# Patient Record
Sex: Male | Born: 1960 | Hispanic: Yes | Marital: Married | Smoking: Never smoker
Health system: Southern US, Community
[De-identification: ages and names within clinical notes are randomized; demographics above are authoritative.]

## PROBLEM LIST (undated history)

## (undated) DIAGNOSIS — I10 Essential (primary) hypertension: Secondary | ICD-10-CM

## (undated) DIAGNOSIS — F419 Anxiety disorder, unspecified: Secondary | ICD-10-CM

## (undated) DIAGNOSIS — E785 Hyperlipidemia, unspecified: Secondary | ICD-10-CM

## (undated) DIAGNOSIS — E1169 Type 2 diabetes mellitus with other specified complication: Secondary | ICD-10-CM

## (undated) DIAGNOSIS — M6740 Ganglion, unspecified site: Secondary | ICD-10-CM

## (undated) DIAGNOSIS — R809 Proteinuria, unspecified: Secondary | ICD-10-CM

## (undated) DIAGNOSIS — K760 Fatty (change of) liver, not elsewhere classified: Secondary | ICD-10-CM

## (undated) DIAGNOSIS — E119 Type 2 diabetes mellitus without complications: Secondary | ICD-10-CM

## (undated) DIAGNOSIS — K219 Gastro-esophageal reflux disease without esophagitis: Secondary | ICD-10-CM

## (undated) DIAGNOSIS — R740 Nonspecific elevation of levels of transaminase and lactic acid dehydrogenase [LDH]: Secondary | ICD-10-CM

## (undated) DIAGNOSIS — M9251 Juvenile osteochondrosis of tibia and fibula, right leg: Secondary | ICD-10-CM

## (undated) DIAGNOSIS — S68119A Complete traumatic metacarpophalangeal amputation of unspecified finger, initial encounter: Secondary | ICD-10-CM

## (undated) DIAGNOSIS — F1020 Alcohol dependence, uncomplicated: Secondary | ICD-10-CM

## (undated) HISTORY — DX: Type 2 diabetes mellitus with other specified complication: E11.69

## (undated) HISTORY — DX: Complete traumatic metacarpophalangeal amputation of unspecified finger, initial encounter: S68.119A

## (undated) HISTORY — DX: Essential (primary) hypertension: I10

## (undated) HISTORY — DX: Alcohol dependence, uncomplicated: F10.20

## (undated) HISTORY — DX: Juvenile osteochondrosis of tibia and fibula, right leg: M92.51

## (undated) HISTORY — DX: Fatty (change of) liver, not elsewhere classified: K76.0

## (undated) HISTORY — DX: Anxiety disorder, unspecified: F41.9

## (undated) HISTORY — DX: Type 2 diabetes mellitus without complications: E11.9

## (undated) HISTORY — DX: Gastro-esophageal reflux disease without esophagitis: K21.9

## (undated) HISTORY — DX: Hyperlipidemia, unspecified: E78.5

## (undated) HISTORY — PX: OTHER SURGICAL HISTORY: SHX169

## (undated) HISTORY — DX: Nonspecific elevation of levels of transaminase and lactic acid dehydrogenase (ldh): R74.0

## (undated) HISTORY — DX: Ganglion, unspecified site: M67.40

## (undated) HISTORY — DX: Proteinuria, unspecified: R80.9

---

## 2007-02-21 ENCOUNTER — Emergency Department (HOSPITAL_COMMUNITY): Admission: EM | Admit: 2007-02-21 | Discharge: 2007-02-21 | Payer: Self-pay | Admitting: Emergency Medicine

## 2007-03-22 ENCOUNTER — Ambulatory Visit (HOSPITAL_COMMUNITY): Admission: RE | Admit: 2007-03-22 | Discharge: 2007-03-22 | Payer: Self-pay | Admitting: Internal Medicine

## 2008-06-21 ENCOUNTER — Inpatient Hospital Stay (HOSPITAL_COMMUNITY): Admission: EM | Admit: 2008-06-21 | Discharge: 2008-06-24 | Payer: Self-pay | Admitting: Emergency Medicine

## 2009-03-22 ENCOUNTER — Ambulatory Visit: Payer: Self-pay | Admitting: Family Medicine

## 2009-03-22 ENCOUNTER — Ambulatory Visit (HOSPITAL_COMMUNITY): Admission: RE | Admit: 2009-03-22 | Discharge: 2009-03-22 | Payer: Self-pay | Admitting: Family Medicine

## 2009-03-22 DIAGNOSIS — I10 Essential (primary) hypertension: Secondary | ICD-10-CM

## 2009-03-22 DIAGNOSIS — F1020 Alcohol dependence, uncomplicated: Secondary | ICD-10-CM

## 2009-03-22 HISTORY — DX: Essential (primary) hypertension: I10

## 2009-03-22 HISTORY — DX: Alcohol dependence, uncomplicated: F10.20

## 2009-03-24 ENCOUNTER — Encounter: Payer: Self-pay | Admitting: Family Medicine

## 2009-03-24 LAB — CONVERTED CEMR LAB
ALT: 52 units/L (ref 0–53)
Albumin: 4.5 g/dL (ref 3.5–5.2)
BUN: 9 mg/dL (ref 6–23)
HCT: 46.2 % (ref 39.0–52.0)
Hemoglobin: 15.2 g/dL (ref 13.0–17.0)
RBC: 5.04 M/uL (ref 4.22–5.81)
Total Bilirubin: 0.5 mg/dL (ref 0.3–1.2)
WBC: 8.6 10*3/uL (ref 4.0–10.5)

## 2009-03-25 ENCOUNTER — Encounter: Payer: Self-pay | Admitting: Family Medicine

## 2009-04-01 IMAGING — CR DG CHEST 2V
2 series · 2 of 2 positions shown · non-contrast
Comparison: none

CLINICAL DATA: Upper back pain.
 CHEST - 2 VIEW: 
 PA and lateral chest - 02/21/07.

[view not recorded (1 of 2)]
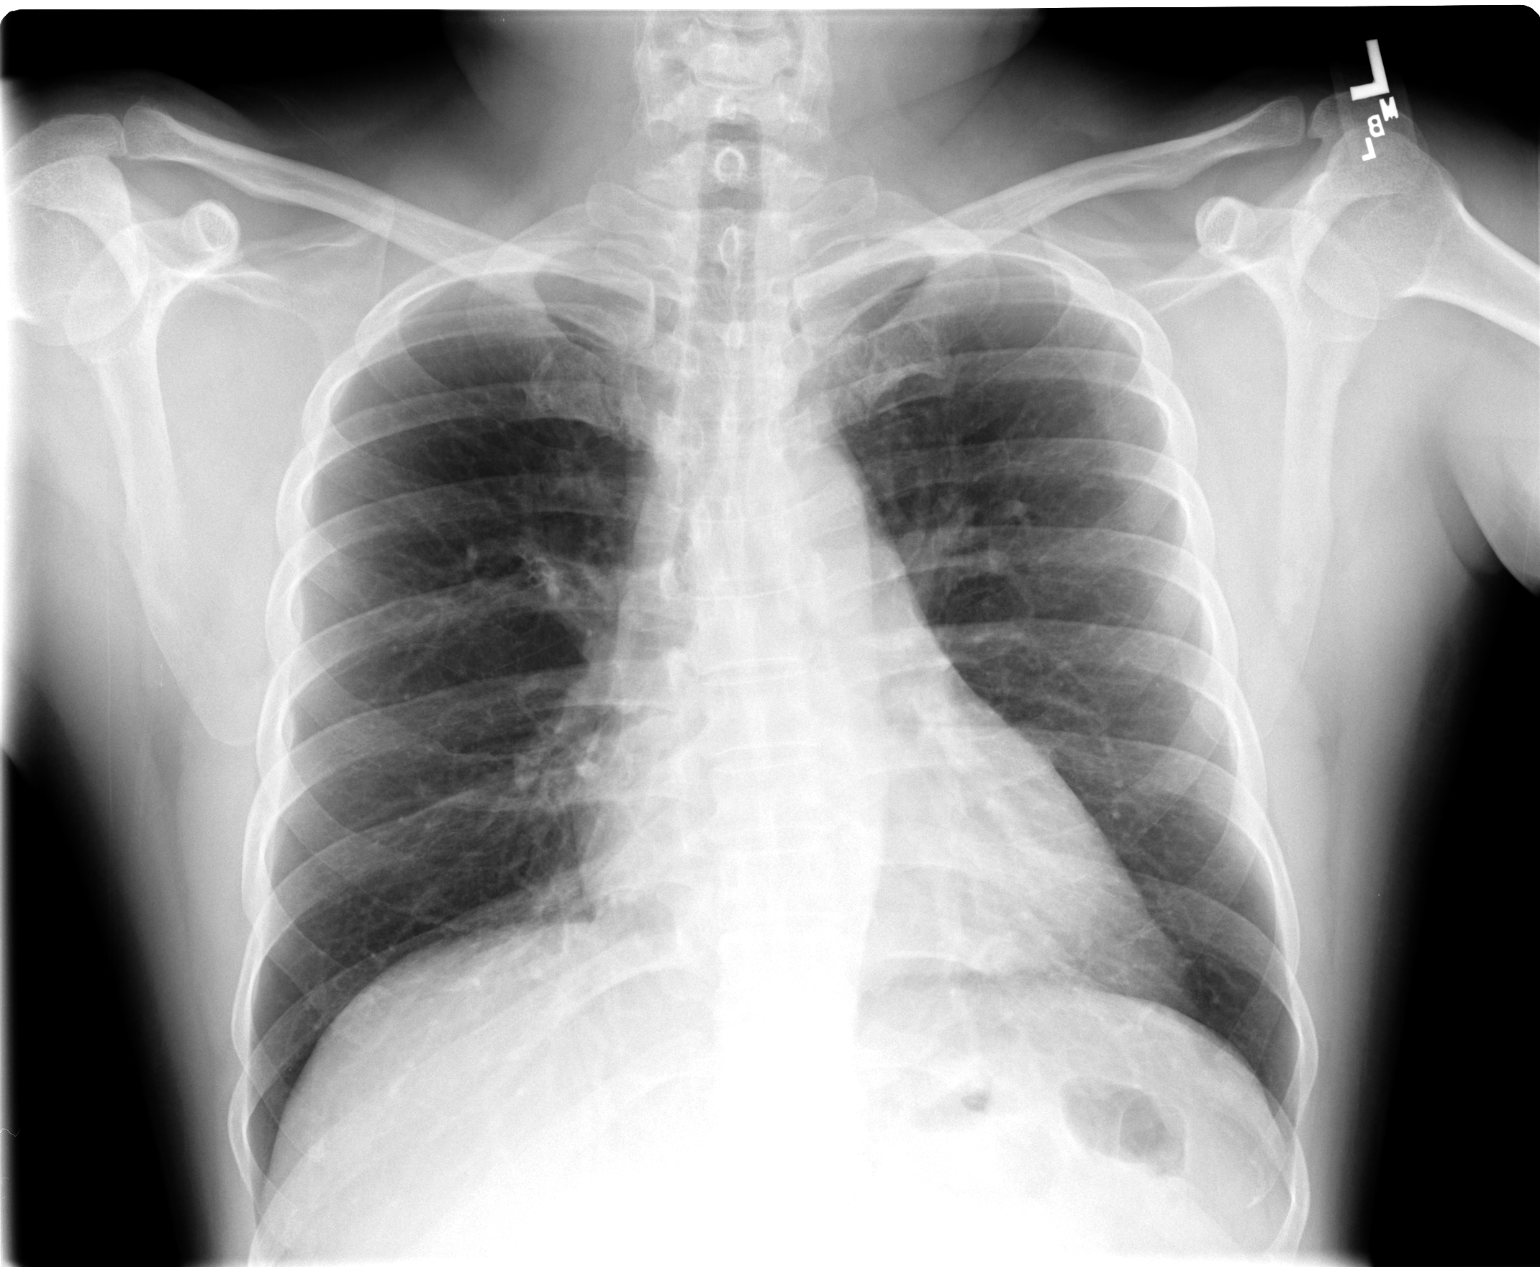

[view not recorded (2 of 2)]
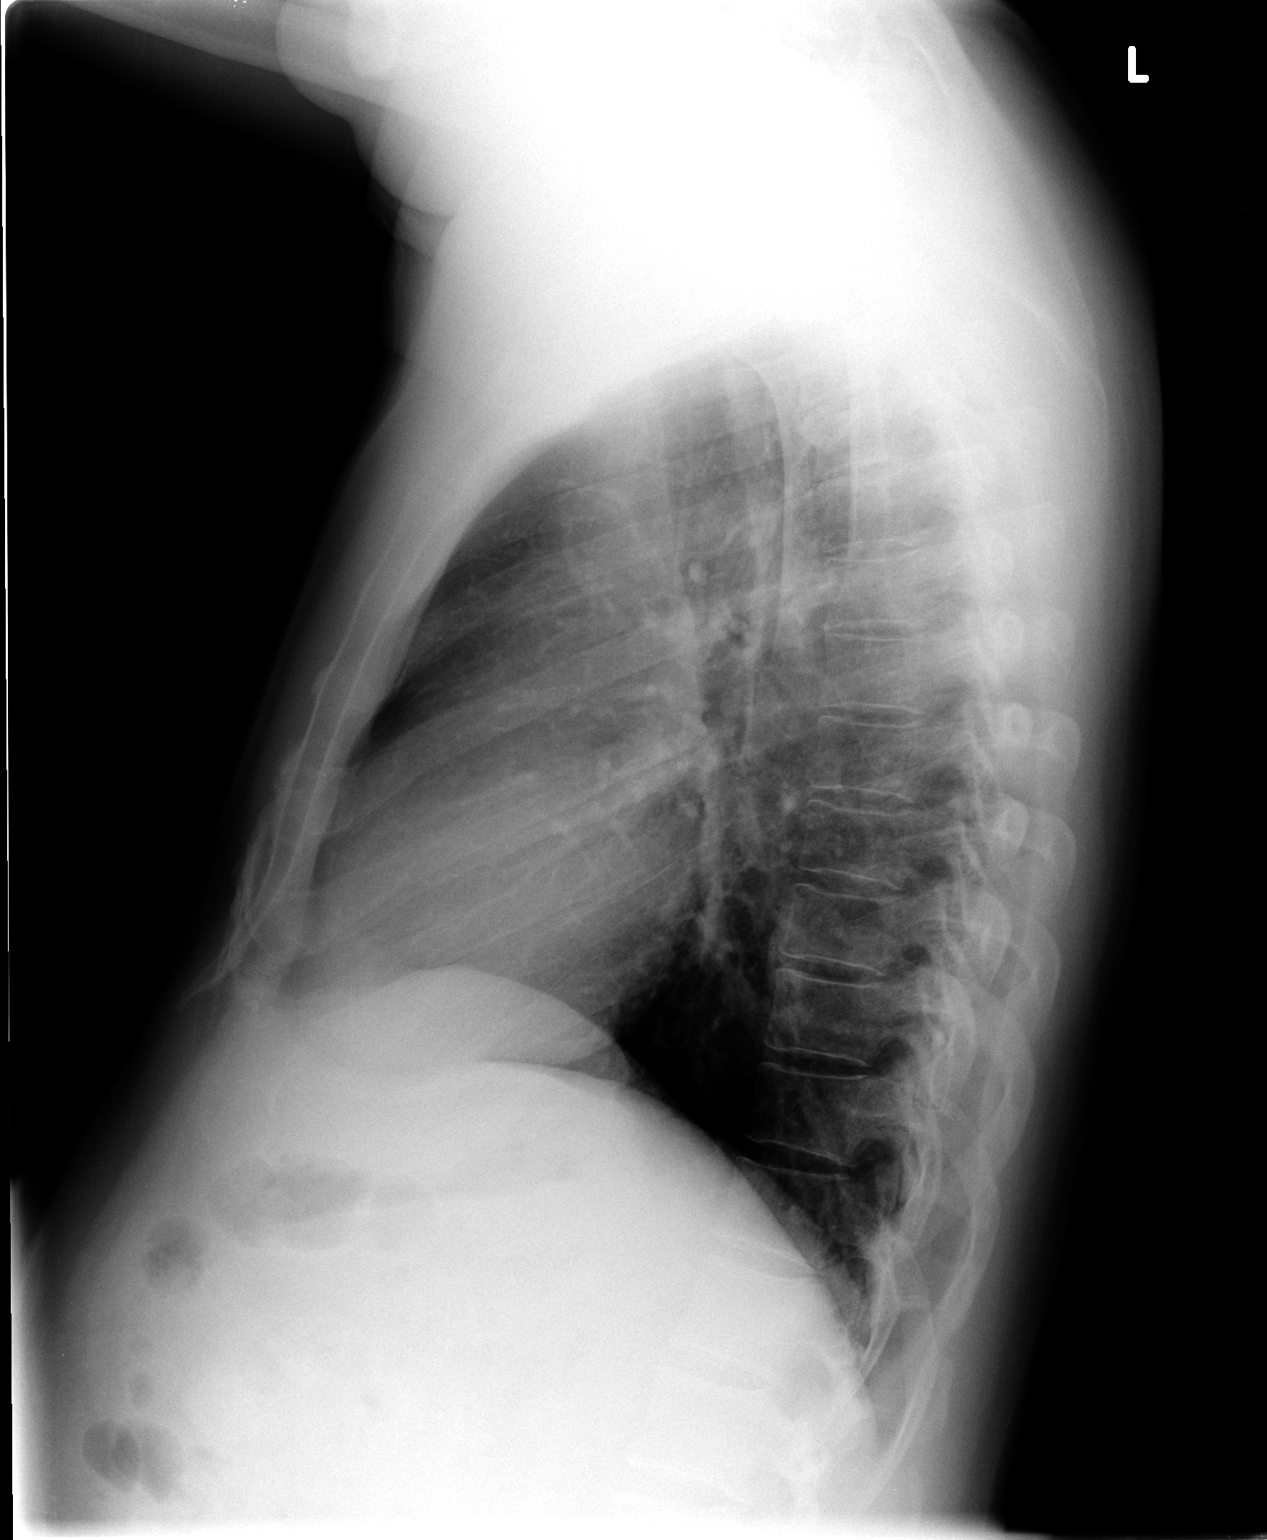

[2 of 2 positions shown; findings below may reference images not displayed]

FINDINGS: The lungs are clear.   The costophrenic angles are sharp.   The heart size is normal.
IMPRESSION: No acute disease.

## 2009-04-05 ENCOUNTER — Ambulatory Visit: Payer: Self-pay | Admitting: Family Medicine

## 2009-04-05 DIAGNOSIS — E785 Hyperlipidemia, unspecified: Secondary | ICD-10-CM | POA: Insufficient documentation

## 2009-04-05 DIAGNOSIS — E118 Type 2 diabetes mellitus with unspecified complications: Secondary | ICD-10-CM | POA: Insufficient documentation

## 2009-04-05 HISTORY — DX: Hyperlipidemia, unspecified: E78.5

## 2009-04-07 HISTORY — PX: OTHER SURGICAL HISTORY: SHX169

## 2009-04-13 ENCOUNTER — Ambulatory Visit: Payer: Self-pay | Admitting: Family Medicine

## 2009-04-13 ENCOUNTER — Ambulatory Visit (HOSPITAL_COMMUNITY): Admission: RE | Admit: 2009-04-13 | Discharge: 2009-04-13 | Payer: Self-pay | Admitting: Sports Medicine

## 2009-04-19 ENCOUNTER — Ambulatory Visit: Payer: Self-pay | Admitting: Family Medicine

## 2009-04-19 DIAGNOSIS — E663 Overweight: Secondary | ICD-10-CM | POA: Insufficient documentation

## 2009-04-20 ENCOUNTER — Ambulatory Visit: Payer: Self-pay | Admitting: Cardiology

## 2009-04-20 LAB — CONVERTED CEMR LAB
Basophils Absolute: 0.1 10*3/uL (ref 0.0–0.1)
Basophils Relative: 0.9 % (ref 0.0–3.0)
CO2: 30 meq/L (ref 19–32)
Calcium: 9.9 mg/dL (ref 8.4–10.5)
Chloride: 99 meq/L (ref 96–112)
Eosinophils Absolute: 0.6 10*3/uL (ref 0.0–0.7)
Eosinophils Relative: 8 % — ABNORMAL HIGH (ref 0.0–5.0)
GFR calc non Af Amer: 95.88 mL/min (ref >60)
Glucose, Bld: 107 mg/dL — ABNORMAL HIGH (ref 70–99)
HCT: 48.3 % (ref 39.0–52.0)
Lymphocytes Relative: 33.4 % (ref 12.0–46.0)
MCHC: 33.9 g/dL (ref 30.0–36.0)
MCV: 93 fL (ref 78.0–100.0)
Monocytes Absolute: 0.7 10*3/uL (ref 0.1–1.0)
Neutro Abs: 3.8 10*3/uL (ref 1.4–7.7)
Neutrophils Relative %: 49.2 % (ref 43.0–77.0)
Platelets: 247 10*3/uL (ref 150.0–400.0)
Prothrombin Time: 10.7 s (ref 9.1–11.7)
RBC: 5.2 M/uL (ref 4.22–5.81)
RDW: 11.6 % (ref 11.5–14.6)

## 2009-04-23 ENCOUNTER — Ambulatory Visit: Payer: Self-pay | Admitting: Cardiology

## 2009-04-23 ENCOUNTER — Ambulatory Visit (HOSPITAL_COMMUNITY): Admission: RE | Admit: 2009-04-23 | Discharge: 2009-04-23 | Payer: Self-pay | Admitting: Cardiology

## 2009-05-18 ENCOUNTER — Ambulatory Visit: Payer: Self-pay | Admitting: Cardiology

## 2009-05-24 ENCOUNTER — Ambulatory Visit: Payer: Self-pay | Admitting: Family Medicine

## 2009-05-24 DIAGNOSIS — I209 Angina pectoris, unspecified: Secondary | ICD-10-CM

## 2009-05-24 DIAGNOSIS — I2510 Atherosclerotic heart disease of native coronary artery without angina pectoris: Secondary | ICD-10-CM | POA: Insufficient documentation

## 2009-06-14 ENCOUNTER — Ambulatory Visit: Payer: Self-pay | Admitting: Cardiology

## 2009-06-14 ENCOUNTER — Ambulatory Visit: Payer: Self-pay | Admitting: Internal Medicine

## 2009-06-15 LAB — CONVERTED CEMR LAB
AST: 20 units/L (ref 0–37)
Cholesterol: 153 mg/dL (ref 0–200)
HDL: 47.8 mg/dL (ref >39.00)
Total Bilirubin: 0.9 mg/dL (ref 0.3–1.2)
Total CHOL/HDL Ratio: 3
Triglycerides: 62 mg/dL (ref 0.0–149.0)
VLDL: 12.4 mg/dL (ref 0.0–40.0)

## 2009-07-05 ENCOUNTER — Ambulatory Visit: Payer: Self-pay | Admitting: Family Medicine

## 2009-08-23 ENCOUNTER — Ambulatory Visit: Payer: Self-pay | Admitting: Family Medicine

## 2009-08-23 LAB — CONVERTED CEMR LAB
BUN: 10 mg/dL (ref 6–23)
Chloride: 93 meq/L — ABNORMAL LOW (ref 96–112)
Creatinine, Ser: 0.7 mg/dL (ref 0.40–1.50)

## 2009-08-27 ENCOUNTER — Telehealth: Payer: Self-pay | Admitting: *Deleted

## 2009-09-20 ENCOUNTER — Ambulatory Visit: Payer: Self-pay | Admitting: Family Medicine

## 2009-09-20 LAB — CONVERTED CEMR LAB: Hgb A1c MFr Bld: 5.6 %

## 2009-09-23 ENCOUNTER — Encounter (INDEPENDENT_AMBULATORY_CARE_PROVIDER_SITE_OTHER): Payer: Self-pay | Admitting: *Deleted

## 2009-11-15 ENCOUNTER — Ambulatory Visit: Payer: Self-pay | Admitting: Cardiology

## 2009-12-13 ENCOUNTER — Ambulatory Visit: Payer: Self-pay | Admitting: Family Medicine

## 2009-12-13 DIAGNOSIS — M79609 Pain in unspecified limb: Secondary | ICD-10-CM

## 2010-05-30 ENCOUNTER — Ambulatory Visit: Payer: Self-pay | Admitting: Family Medicine

## 2010-05-30 DIAGNOSIS — R809 Proteinuria, unspecified: Secondary | ICD-10-CM | POA: Insufficient documentation

## 2010-06-20 ENCOUNTER — Encounter: Payer: Self-pay | Admitting: Family Medicine

## 2010-06-20 ENCOUNTER — Ambulatory Visit: Payer: Self-pay | Admitting: Family Medicine

## 2010-06-20 LAB — CONVERTED CEMR LAB
BUN: 9 mg/dL (ref 6–23)
Chloride: 99 meq/L (ref 96–112)
HDL: 72 mg/dL (ref >39)
Total CHOL/HDL Ratio: 3.1

## 2010-06-24 ENCOUNTER — Encounter: Payer: Self-pay | Admitting: Family Medicine

## 2010-06-27 ENCOUNTER — Ambulatory Visit: Payer: Self-pay | Admitting: Family Medicine

## 2010-09-06 NOTE — Assessment & Plan Note (Signed)
Summary: f/up DM,Htn,Etoh,tcb   Vital Signs:  Patient profile:   50 year old male Height:      62 inches Weight:      134.1 pounds BMI:     24.62 Pulse rate:   71 / minute BP sitting:   136 / 86  (left arm)  Vitals Entered By: Arlyss Repress CMA, (September 20, 2009 4:11 PM) CC: f/up DM/HTN. refill meds. Is Patient Diabetic? Yes Pain Assessment Patient in pain? no        Primary Care Provider:  Dr. Sheffield Slider  CC:  f/up DM/HTN. refill meds..  History of Present Illness: Jeffrey Gray has felt generally well. 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 Gets soreness in his back muscles and has had a buzzing feeling (like a cell phone) in his left anterior thigh, but no numbness there now. Ran out of Metformin 3 days ago.   He continues to drink 8 or more beers with his brother on weekend, but no more than 2 on week nights.  Daughter, [name], is with him and affirms that his alcohol is a problem.   He hasn't had hypoglycemia symptoms. Feels hungry now since he hasn't eaten since 10 AM.   Habits & Providers  Alcohol-Tobacco-Diet     Tobacco Status: never  Allergies: No Known Drug Allergies  Physical Exam  General:  Well-developed,well-nourished,in no acute distress; alert,appropriate and cooperative throughout examination Lungs:  Normal respiratory effort, chest expands symmetrically. Lungs are clear to auscultation, no crackles or wheezes. Heart:  Normal rate and regular rhythm. S1 and S2 normal without gallop, murmur, click, rub or other extra sounds. Abdomen:  Bowel sounds positive,abdomen soft and non-tender without masses, organomegaly or hernias noted. Psych:  normally interactive, good eye contact, not anxious appearing, and not depressed appearing.     Impression & Recommendations:  Problem # 1:  DIABETES MELLITUS, TYPE II (ICD-250.00) Improved control and in range to have hypoglycemic symptoms. Will decrease Metformin The following medications were removed from  the medication list:    Lisinopril 40 Mg Tabs (Lisinopril) .Marland Kitchen... Take 1/2 tab daily His updated medication list for this problem includes:    Metformin Hcl 1000 Mg Tabs (Metformin hcl) .Marland Kitchen... Tomar 1/2  tab po qd   for diabetes    Aspirin 81 Mg Tbec (Aspirin) .Marland Kitchen... 1 daily    Zestoretic 20-12.5 Mg Tabs (Lisinopril-hydrochlorothiazide) .Marland Kitchen... Take one tablet daily  Orders: A1C-[facility] (11914) [facility]- Est  Level 4 (78295)  Problem # 2:  HYPERTENSION (ICD-401.9)  Not at goal. Hyponatremia on 25 mg HCTZ so will decrease that and use up the Lisinopril over the next 8 days then replace with Zestoretic.  The following medications were removed from the medication list:    Hydrochlorothiazide 25 Mg Tabs (Hydrochlorothiazide) .Marland Kitchen... 1 tablet by mouth daily for blood pressure    Lisinopril 40 Mg Tabs (Lisinopril) .Marland Kitchen... Take 1/2 tab daily His updated medication list for this problem includes:    Metoprolol Tartrate 25 Mg Tabs (Metoprolol tartrate) .Marland Kitchen... 1 tablet twice a day    Norvasc 5 Mg Tabs (Amlodipine besylate) .Marland Kitchen... Take one tablet daily    Zestoretic 20-12.5 Mg Tabs (Lisinopril-hydrochlorothiazide) .Marland Kitchen... Take one tablet daily  Orders: [facility]- Est  Level 4 (62130)  Problem # 3:  CORONARY ARTERY SPASM (ICD-413.9)  Pulse is 71 so will increase Metoprolol to help decrease blood pressure  The following medications were removed from the medication list:    Hydrochlorothiazide 25 Mg Tabs (Hydrochlorothiazide) .Marland Kitchen... 1 tablet by  mouth daily for blood pressure    Lisinopril 40 Mg Tabs (Lisinopril) .Marland Kitchen... Take 1/2 tab daily His updated medication list for this problem includes:    Metoprolol Tartrate 25 Mg Tabs (Metoprolol tartrate) .Marland Kitchen... 1 tablet twice a day    Nitrostat 0.4 Mg Subl (Nitroglycerin) .Marland Kitchen... 1 under your tongue as needed for chest pain-you may take 1 every 5 minutes x 3    Norvasc 5 Mg Tabs (Amlodipine besylate) .Marland Kitchen... Take one tablet daily    Aspirin 81 Mg Tbec (Aspirin) .Marland Kitchen... 1 daily     Zestoretic 20-12.5 Mg Tabs (Lisinopril-hydrochlorothiazide) .Marland Kitchen... Take one tablet daily  Orders: [facility]- Est  Level 4 (27253)  Problem # 4:  OVERWEIGHT (ICD-278.02) Now in normal range BMI under 25  Problem # 5:  ALCOHOL ABUSE (ICD-305.00) He committed to limiting beers to two a day. I suggested substituting non-alcoholic beer. I'm dubious that he is committed to this change yet.  Orders: [facility]- Est  Level 4 (66440)  Complete Medication List: 1)  Pravastatin Sodium 40 Mg Tabs (Pravastatin sodium) .Marland Kitchen.. 1 tablet daily at bedtime for cholesterol. instructions in spanish 2)  Metformin Hcl 1000 Mg Tabs (Metformin hcl) .... Tomar 1/2  tab po qd   for diabetes 3)  Metoprolol Tartrate 25 Mg Tabs (Metoprolol tartrate) .Marland Kitchen.. 1 tablet twice a day 4)  Nitrostat 0.4 Mg Subl (Nitroglycerin) .Marland Kitchen.. 1 under your tongue as needed for chest pain-you may take 1 every 5 minutes x 3 5)  Norvasc 5 Mg Tabs (Amlodipine besylate) .... Take one tablet daily 6)  Aspirin 81 Mg Tbec (Aspirin) .Marland Kitchen.. 1 daily 7)  Zestoretic 20-12.5 Mg Tabs (Lisinopril-hydrochlorothiazide) .... Take one tablet daily  Patient Instructions: 1)  Please schedule a follow-up appointment in 2 months.  2)  Regrese en 2 meses para chequeo de presion y [place]. 3)  Para 8 dias tome la mitad de HCTZ y la mitad de Lisinopril. Despues replazar los dos con una combinacion de HCTZ y Lisinopril 4)  Aumente las dosis de Metoprolol hasta una tableta [employer] veces al dia.  Prescriptions: METFORMIN HCL 1000 MG TABS (METFORMIN HCL) Tomar 1/2  tab po qd   For diabetes  #30 x 11   Entered and Authorized by:   Zachery Dauer MD   Signed by:   Zachery Dauer MD on 09/20/2009   Method used:   Electronically to        [facility] (retail)       [address]       [place], [place]  [address]       Ph: [phone]       Fax: [phone]   RxID:   [id number] ZESTORETIC 20-12.5 MG TABS (LISINOPRIL-HYDROCHLOROTHIAZIDE) Take one tablet  daily  #30 x 11   Entered and Authorized by:   Zachery Dauer MD   Signed by:   Zachery Dauer MD on 09/20/2009   Method used:   Electronically to        [facility] (retail)       [address]       [place], [place]  [address]       Ph: [phone]       Fax: [phone]   RxID:   [id number] METOPROLOL TARTRATE 25 MG TABS (METOPROLOL TARTRATE) 1 tablet twice a day  #60 x 11   Entered and Authorized by:  Zachery Dauer MD   Signed by:   Zachery Dauer MD on 09/20/2009   Method used:   Electronically to        [facility] (retail)       [address]       [place], [place]  [address]       Ph: [phone]       Fax: [phone]   RxID:   [id number]   Laboratory Results   Blood Tests   Date/Time Received: September 20, 2009 4:22 PM  Date/Time Reported: September 20, 2009 4:30 PM   HGBA1C: 5.6%   (Normal Range: Non-Diabetic - 3-6%   Control Diabetic - 6-8%)  Comments: ...........test performed by...........Marland KitchenTerese Door, CMA       Prevention & Chronic Care Immunizations   Influenza vaccine: Fluvax Non-MCR  (05/24/2009)    Tetanus booster: 05/24/2009: Tdap    Pneumococcal vaccine: Pneumovax  (07/05/2009)  Other Screening   Smoking status: never  (09/20/2009)  Diabetes Mellitus   HgbA1C: 5.6  (09/20/2009)    Eye exam: Not documented    Foot exam: yes  (04/05/2009)   High risk foot: Not documented   Foot care education: Not documented    Urine microalbumin/creatinine ratio: Not documented    Diabetes flowsheet reviewed?: Yes   Progress toward A1C goal: At goal  Lipids   Total Cholesterol: 153  (06/14/2009)   LDL: 93  (06/14/2009)   LDL Direct: 162  (03/22/2009)   HDL: 47.80  (06/14/2009)   Triglycerides: 62.0  (06/14/2009)    SGOT (AST): 20  (06/14/2009)   SGPT (ALT): 19  (06/14/2009)   Alkaline phosphatase: 75  (06/14/2009)   Total bilirubin: 0.9  (06/14/2009)    Lipid flowsheet  reviewed?: Yes   Progress toward LDL goal: At goal  Hypertension   Last Blood Pressure: 136 / 86  (09/20/2009)   Serum creatinine: 0.70  (08/23/2009)   Serum potassium 4.6  (08/23/2009)    Hypertension flowsheet reviewed?: Yes   Progress toward BP goal: Improved  Self-Management Support :   Personal Goals (by the next clinic visit) :     Personal A1C goal: 7  (04/19/2009)     Personal blood pressure goal: 130/80  (04/19/2009)     Personal LDL goal: 70  (04/19/2009)    Diabetes self-management support: Written self-care plan  (04/19/2009)    Hypertension self-management support: Written self-care plan  (04/19/2009)    Lipid self-management support: Written self-care plan  (04/19/2009)

## 2010-09-06 NOTE — Progress Notes (Signed)
Summary: re: HCTZ/TS  ---- Converted from flag ---- ---- 08/27/2009 7:32 AM, Zachery Dauer MD wrote: Please ask Sr Rodriquez to decrease his HCTZ to 1/2 tab each morning and scedule a follow-up visit in 2-3 weeks. ------------------------------ called pt's house and spoke with pt's wife, request for pt to call us back. waiting for pt to call back. Arlyss Repress CMA,  August 27, 2009 9:57 AM pt's dtr [name] called back and i infrormed her of dr.hale's message.Arlyss Repress CMA,  August 27, 2009 1:57 PM

## 2010-09-06 NOTE — Assessment & Plan Note (Signed)
Summary: f/u cholesterol/bmc   FLU SHOT GIVEN TODAY.Jeffrey Gray, CMA  May 30, 2010 3:54 PM  Vital Signs:  Patient profile:   50 year old male Height:      62 inches Weight:      139 pounds BMI:     25.52 Temp:     98.9 degrees F oral Pulse rate:   87 / minute BP sitting:   158 / 90  (right arm) Cuff size:   regular  Vitals Entered By: Tessie Fass CMA (May 30, 2010 2:24 PM)  Serial Vital Signs/Assessments:  Time      Position  BP       Pulse  Resp  Temp     By                     145/90                         Angelena Sole MD   Primary Care Provider:  Dr. Sheffield Slider  CC:  f/u HTN, HLD, and and DM.  History of Present Illness: 1. DMII:  Overall his sugars are well controlled.  He doesn't check his blood sugars at home.  He has also been watching his diet.  He is taking Metformin as prescribed.  ROS: denies vision changes  2. HTN:  He doesn't check his blood pressure regularly.  He is taking the Lisinopril-HCTZ and Norvasc but not the Metoprolol.  States that the Norvasc is going to cost him around $100 to fill and would like to stop it.  ROS: denies chest pain and shortness of breath  3. HLD:  He is taking the Pravastatin as prescribed.  Trying to watch his diet.  ROS: denies claudication  Current Medications (verified): 1)  Pravastatin Sodium 40 Mg Tabs (Pravastatin Sodium) .Marland Kitchen.. 1 Tablet Daily At Bedtime For Cholesterol. Instructions in Spanish 2)  Metformin Hcl 1000 Mg Tabs (Metformin Hcl) .... Tomar 1/2  Tab Po Qd   For Diabetes 3)  Metoprolol Tartrate 25 Mg Tabs (Metoprolol Tartrate) .Marland Kitchen.. 1 Tablet Twice A Day 4)  Nitrostat 0.4 Mg Subl (Nitroglycerin) .Marland Kitchen.. 1 Under Your Tongue As Needed For Chest Pain-You May Take 1 Every 5 Minutes X 3 5)  Aspirin 81 Mg Tbec (Aspirin) .Marland Kitchen.. 1 Daily 6)  Zestoretic 20-12.5 Mg Tabs (Lisinopril-Hydrochlorothiazide) .... Take Two Tabs By Mouth Daily  Allergies (verified): No Known Drug Allergies  Past History:  Past Medical  History: Reviewed history from 11/12/2009 and no changes required. 1. HTN 2. alcohol abuse vs dependence 3. Chest pain: ETT (9/10) equivocal (8.5 METS, nonspecific changes but chest pain with exertion).  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 EF 65%. There was, however, noted to be significant vasospasm in the left main and LAD relieved by intracoronary nitroglycerine.  He is now on amlodipine for treatment of possible coronary vasospasm.  4. DM2 5. Hyperlipidemia  Social History: Reviewed history from 05/24/2009 and no changes required. Llives with wife and 3 children, also has a grown son.   Works Administrator buildings.   Education 10th grade Never smoked.   Drank heavily in the past but has cut back.  Moved here from [place] about 10 years from [place].    Physical Exam  General:  Vitals reviewed and rechecked.  Slightly overweight.  well appearing, no apparent distress. Eyes:  vision grossly intact.   Neck:  supple and full ROM.   Lungs:  Normal respiratory effort,  chest expands symmetrically. Lungs are clear to auscultation, no crackles or wheezes. Heart:  Normal rate and regular rhythm. S1 and S2 normal without gallop, murmur, click, rub or other extra sounds. Extremities:  no LE edema Neurologic:  alert & oriented X3 and gait normal.     Impression & Recommendations:  Problem # 1:  DIABETES MELLITUS, TYPE II (ICD-250.00) Assessment Unchanged HgA1C 5.9 today.  At goal.  Continue current medications His updated medication list for this problem includes:    Metformin Hcl 1000 Mg Tabs (Metformin hcl) .Marland Kitchen... Tomar 1/2  tab po qd   for diabetes    Aspirin 81 Mg Tbec (Aspirin) .Marland Kitchen... 1 daily    Zestoretic 20-12.5 Mg Tabs (Lisinopril-hydrochlorothiazide) .Marland Kitchen... Take two tabs by mouth daily  Orders: UA Microalbumin-[facility] (82044) A1C-[facility] (52841) [facility]- Est  Level 4 (32440)  Problem # 2:  HYPERTENSION (ICD-401.9) Assessment: Unchanged  Not at goal.  Will make some  medication adjustments.  Will stop Amlodipine because of cost.  Will restart Metoprolol and increase Zestoretic.  Will have pt f/u in 1 month to recheck. The following medications were removed from the medication list:    Amlodipine Besylate 5 Mg Tabs (Amlodipine besylate) ..... One and one-half tablets daily His updated medication list for this problem includes:    Metoprolol Tartrate 25 Mg Tabs (Metoprolol tartrate) .Marland Kitchen... 1 tablet twice a day    Zestoretic 20-12.5 Mg Tabs (Lisinopril-hydrochlorothiazide) .Marland Kitchen... Take two tabs by mouth daily  Orders: Basic Met-[facility] ([address]272-53664) [facility]- Est  Level 4 (40347)  Problem # 3:  HYPERLIPIDEMIA (ICD-272.4) Assessment: Unchanged  Last LDL 93 in 06/2009.  Will have him come back and recheck 1 week prior to his next appointment. His updated medication list for this problem includes:    Pravastatin Sodium 40 Mg Tabs (Pravastatin sodium) .Marland Kitchen... 1 tablet daily at bedtime for cholesterol. 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 1 tablet daily at bedtime for cholesterol. instructions in spanish 2)  Metformin Hcl 1000 Mg Tabs (Metformin hcl) .... Tomar 1/2  tab po qd   for diabetes 3)  Metoprolol Tartrate 25 Mg Tabs (Metoprolol tartrate) .Marland Kitchen.. 1 tablet twice a day 4)  Nitrostat 0.4 Mg Subl (Nitroglycerin) .Marland Kitchen.. 1 under your tongue as needed for chest pain-you may take 1 every 5 minutes x 3 5)  Aspirin 81 Mg Tbec (Aspirin) .Marland Kitchen.. 1 daily 6)  Zestoretic 20-12.5 Mg Tabs (Lisinopril-hydrochlorothiazide) .... Take two tabs by mouth daily  Other Orders: Flu Vaccine 50yrs + (95188) Admin 1st Vaccine (41660)  Patient Instructions: 1)  It was nice to meet you today 2)  We are going to make some changes to your blood pressure  medicines. 3)  I want you to stop taking the Amlodipine.  For the Zestoretic take two tabs once a day and start taking the Metoprolol again. 4)  We will check your cholesterol 1 week prior to your next appointment, this will be a fasting lab draw so don't eat for 8 hours prior to your visit. 5)  Please schedule a lab visit in 3 weeks and an office visit with Dr. Sheffield Slider in 4 weeks. Prescriptions: METOPROLOL TARTRATE 25 MG TABS (METOPROLOL TARTRATE) 1 tablet twice a day  #60 x 6   Entered by:   Angelena Sole MD   Authorized by:  Zachery Dauer MD   Signed by:   Angelena Sole MD on 05/30/2010   Method used:   Electronically to        [facility] (retail)       [address]       [place], [place]  [address]       Ph: [phone]       Fax: [phone]   RxID:   [id number] ZESTORETIC 20-12.5 MG TABS (LISINOPRIL-HYDROCHLOROTHIAZIDE) Take two tabs by mouth daily  #60 x 6   Entered by:   Angelena Sole MD   Authorized by:   Zachery Dauer MD   Signed by:   Angelena Sole MD on 05/30/2010   Method used:   Electronically to        [facility] (retail)       [address]       [place], [place]  [address]       Ph: [phone]       Fax: [phone]   RxID:   [id number]    Orders Added: 1)  UA Microalbumin-[facility] [82044] 2)  A1C-[facility] [83036] 3)  Basic Met-[facility] [51025-85277] 4)  Lipid-[facility] [80061-22930] 5)  Flu Vaccine 84yrs + [90658] 6)  Admin 1st Vaccine [90471] 7)  [facility]- Est  Level 4 [82423]   Immunizations Administered:  Influenza Vaccine # 1:    Vaccine Type: Fluvax 3+    Site: left deltoid    Mfr: GlaxoSmithKline    Dose: 0.5 ml    Route: IM    Given by: Jeffrey Gray, CMA    Exp. Date: 02/01/2011    Lot #: [id number]    VIS given: 03/01/10 version given May 30, 2010.  Flu Vaccine Consent Questions:    Do you have a history of severe allergic reactions to this vaccine? no    Any prior history of allergic  reactions to egg and/or gelatin? no    Do you have a sensitivity to the preservative Thimersol? no    Do you have a past history of Guillan-Barre Syndrome? no    Do you currently have an acute febrile illness? no    Have you ever had a severe reaction to latex? no    Vaccine information given and explained to patient? yes   Immunizations Administered:  Influenza Vaccine # 1:    Vaccine Type: Fluvax 3+    Site: left deltoid    Mfr: GlaxoSmithKline    Dose: 0.5 ml    Route: IM    Given by: Jeffrey Gray, CMA    Exp. Date: 02/01/2011    Lot #: [id number]    VIS given: 03/01/10 version given May 30, 2010.  Laboratory Results   Urine Tests  Date/Time Received: May 30, 2010 2:29 PM  Date/Time Reported: May 30, 2010 2:57 PM   Microalbumin (urine): 30 mg/L Creatinine: 50mg /dL  A:C Ratio 93-267 Abnormal Comments: ...........test performed by...........Marland KitchenTerese Door, CMA   Blood Tests   Date/Time Received: May 30, 2010 2:29 PM  Date/Time Reported: May 30, 2010 2:44 PM   HGBA1C: 5.9%   (Normal Range: Non-Diabetic - 3-6%   Control Diabetic - 6-8%)  Comments: ...........test performed by...........Marland KitchenTessie Fass, CMA entered by Terese Door, CMA           Prevention & Chronic Care Immunizations   Influenza vaccine: Fluvax 3+  (05/30/2010)    Tetanus booster: 05/24/2009: Tdap  Pneumococcal vaccine: Pneumovax  (07/05/2009)  Other Screening   Smoking status: never  (12/13/2009)  Diabetes Mellitus   HgbA1C: 5.9  (05/30/2010)    Eye exam: Not documented    Foot exam: yes  (12/13/2009)   Foot exam action/deferral: Do today   High risk foot: No  (12/13/2009)   Foot care education: Done  (12/13/2009)    Urine microalbumin/creatinine ratio: Not documented    Diabetes flowsheet reviewed?: Yes   Progress toward A1C goal: At goal  Lipids   Total Cholesterol: 153  (06/14/2009)   LDL: 93  (06/14/2009)   LDL Direct: 162  (03/22/2009)   HDL:  47.80  (06/14/2009)   Triglycerides: 62.0  (06/14/2009)    SGOT (AST): 20  (06/14/2009)   SGPT (ALT): 19  (06/14/2009)   Alkaline phosphatase: 75  (06/14/2009)   Total bilirubin: 0.9  (06/14/2009)   Liver panel due: 06/30/2010    Lipid flowsheet reviewed?: Yes   Progress toward LDL goal: Unchanged  Hypertension   Last Blood Pressure: 158 / 90  (05/30/2010)   Serum creatinine: 0.70  (08/23/2009)   Serum potassium 4.6  (08/23/2009)    Hypertension flowsheet reviewed?: Yes   Progress toward BP goal: Deteriorated  Self-Management Support :   Personal Goals (by the next clinic visit) :     Personal A1C goal: 7  (04/19/2009)     Personal blood pressure goal: 130/80  (04/19/2009)     Personal LDL goal: 70  (04/19/2009)    Diabetes self-management support: Written self-care plan  (04/19/2009)    Hypertension self-management support: Written self-care plan  (04/19/2009)    Lipid self-management support: Written self-care plan  (04/19/2009)

## 2010-09-06 NOTE — Letter (Signed)
Summary: Appointment - Reminder 2  [facility], Main Office  [address]   [place], [place] [address]   Phone: [phone]  Fax: [phone]     September 23, 2009 MRN: [id number]   RAYNALD ROUILLARD [address]  [address] [place], [place]  [address]   Dear Mr. Alcalde,  Our records indicate that it is time to schedule a follow-up appointment with Dr. Shirlee Latch. It is very important that we reach you to schedule this appointment. We look forward to participating in your health care needs. Please contact us at the number listed above at your earliest convenience to schedule your appointment.  If you are unable to make an appointment at this time, give [place] a call so we can update our records.     Sincerely,   Marlana Latus [facility] Scheduling Team

## 2010-09-06 NOTE — Assessment & Plan Note (Signed)
Summary: prob list rev   

## 2010-09-06 NOTE — Assessment & Plan Note (Signed)
Summary: f/u  kh   Vital Signs:  Patient profile:   50 year old male Height:      62 inches Weight:      136 pounds BMI:     24.96 Temp:     98.4 degrees F oral Pulse rate:   69 / minute BP sitting:   131 / 83  (left arm) Cuff size:   regular  Vitals Entered By: Tessie Fass CMA (June 27, 2010 2:14 PM) CC: F/U BP, labs and Meds Is Patient Diabetic? Yes Pain Assessment Patient in pain? no        Primary Care Provider:  Dr. Sheffield Slider  CC:  F/U BP and labs and Meds.  History of Present Illness: Pt seen and examined by Milinda Antis MD, PGY-3  HTN- early his am he took his BP medication had a "spasm" in chest, he has had in the past, lasted 15-20 minutes, denies chest pain but felt pressure and spasm from neck to chest, no difficutty breathing, no nausea or vomiting. Did not take nitroglycerin  Took BP at [facility] took it three times 140-150 as stystolic and 90's diastolic Takes 2 tablets of Lisinopril/HCTZ daily Taking  Metoprolol  25mg  once a day   Cholesterol test results--  Forgets to take  pravastatin  3 times a week, states has not been eating very healthy recently  DM- A1C 5.9%,  daughter states he has had a few episodes when he has been sweaty, shakey, heart racing and weak, last occurance a few months a ago . Taking 1000mg  tabs 1/2 tab daily   ETOH- still drinkiing a lot of alcohol, mainly on weekends, daughter [name] at visits and reports this. He drinks with friend or alone. Falls down at times, but is not irritable or abusive.    Habits & Providers  Alcohol-Tobacco-Diet     Tobacco Status: never  Current Medications (verified): 1)  Pravastatin Sodium 40 Mg Tabs (Pravastatin Sodium) .Marland Kitchen.. 1 Tablet Daily At Bedtime For Cholesterol. Instructions in Spanish 2)  Metformin Hcl 500 Mg Xr24h-Tab (Metformin Hcl) .Marland Kitchen.. 1 By Mouth Daily For Diabetes Instruction in Spainish 3)  Metoprolol Tartrate 25 Mg Tabs (Metoprolol Tartrate) .Marland Kitchen.. 1 Tablet Twice A Day 4)  Nitrostat  0.4 Mg Subl (Nitroglycerin) .Marland Kitchen.. 1 Under Your Tongue As Needed For Chest Pain-You May Take 1 Every 5 Minutes X 3 5)  Aspirin 81 Mg Tbec (Aspirin) .Marland Kitchen.. 1 Daily 6)  Zestoretic 20-12.5 Mg Tabs (Lisinopril-Hydrochlorothiazide) .... Take Two Tabs By Mouth Daily  Allergies (verified): No Known Drug Allergies  Past History:  Past Medical History: Last updated: 11/12/2009 1. HTN 2. alcohol abuse vs dependence 3. Chest pain: ETT (9/10) equivocal (8.5 METS, nonspecific changes but chest pain with exertion).  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 EF 65%. There was, however, noted to be significant vasospasm in the left main and LAD relieved by intracoronary nitroglycerine.  He is now on amlodipine for treatment of possible coronary vasospasm.  4. DM2 5. Hyperlipidemia  Physical Exam  General:  Vitals reviewed and rechecked.  Slightly overweight.  well appearing, no apparent distress. Lungs:  Normal respiratory effort, chest expands symmetrically. Lungs are clear to auscultation, no crackles or wheezes. Heart:  RRR,no murmur Pulses:  Radial pulses 2+ Extremities:  no LE edema   Impression & Recommendations:  Problem # 1:  HYPERTENSION (ICD-401.9) Assessment Improved  Will  have pt take Metoprolol twice a day as prescribed, continue Combo Zestorectic goal < 130/80 His updated medication list for  this problem includes:    Metoprolol Tartrate 25 Mg Tabs (Metoprolol tartrate) .Marland Kitchen... 1 tablet twice a day    Zestoretic 20-12.5 Mg Tabs (Lisinopril-hydrochlorothiazide) .Marland Kitchen... Take two tabs by mouth daily  Orders: [facility]- Est  Level 4 (04540)  Problem # 2:  HYPERLIPIDEMIA (ICD-272.4) Assessment: Deteriorated   Explained importance of Statin and complications of hyperlipidemia. Will not increase dose as pt not taking as prescribed. Will recheck direct LDL in 3months His updated medication list for this problem includes:    Pravastatin Sodium 40 Mg Tabs (Pravastatin sodium) .Marland Kitchen... 1 tablet  daily at bedtime for cholesterol. instructions in spanish  Orders: [facility]- Est  Level 4 (98119)  Problem # 3:  DIABETES MELLITUS, TYPE II (ICD-250.00) Assessment: Unchanged  A1C at goal, difficult to tell if hypoglcyemic episode secondary to Meformin alone or in conjunction with alcoholism and pt not eating well when he binges. Will change to extended release tablet   His updated medication list for this problem includes:    Metformin Hcl 500 Mg Xr24h-tab (Metformin hcl) .Marland Kitchen... 1 by mouth daily for diabetes instruction in spainish    Aspirin 81 Mg Tbec (Aspirin) .Marland Kitchen... 1 daily    Zestoretic 20-12.5 Mg Tabs (Lisinopril-hydrochlorothiazide) .Marland Kitchen... Take two tabs by mouth daily  Labs Reviewed: Creat: 0.73 (06/20/2010)   Microalbumin: 30 (05/30/2010) Reviewed HgBA1c results: 5.9 (05/30/2010)  5.6 (09/20/2009)  Orders: [facility]- Est  Level 4 (14782)  Problem # 4:  ALCOHOL ABUSE (ICD-305.00) Assessment: Unchanged  Discussed complications of alcoholism, i.e. cirrhosis  Orders: [facility]- Est  Level 4 (95621)  Complete Medication List: 1)  Pravastatin Sodium 40 Mg Tabs (Pravastatin sodium) .Marland Kitchen.. 1 tablet daily at bedtime for cholesterol. instructions in spanish 2)  Metformin Hcl 500 Mg Xr24h-tab (Metformin hcl) .Marland Kitchen.. 1 by mouth daily for diabetes instruction in spainish 3)  Metoprolol Tartrate 25 Mg Tabs (Metoprolol tartrate) .Marland Kitchen.. 1 tablet twice a day 4)  Nitrostat 0.4 Mg Subl (Nitroglycerin) .Marland Kitchen.. 1 under your tongue as needed for chest pain-you may take 1 every 5 minutes x 3 5)  Aspirin 81 Mg Tbec (Aspirin) .Marland Kitchen.. 1 daily 6)  Zestoretic 20-12.5 Mg Tabs (Lisinopril-hydrochlorothiazide) .... Take two tabs by mouth daily  Patient Instructions: 1)  Your bad cholesterol was 128, we would like this number to be 70 2)  Please try to remember to take the Pravastatin each night- set alarm or take before changing for bed 3)  Your blood pressure has improved, continue taking the Lisinopril/HCTZ 2 tablets a  day 4)  Continue the Metoprolol but take 1 tablet twice a day 5)  Use up the Metformin tablets you have, then start the 500mg  ER tablet once day  6)  Next visit in 3 months to follow-up  Prescriptions: METFORMIN HCL 500 MG XR24H-TAB (METFORMIN HCL) 1 by mouth daily for diabetes Instruction in spainish  #30 x 3   Entered by:   Milinda Antis MD   Authorized by:   Zachery Dauer MD   Signed by:   Milinda Antis MD on 06/27/2010   Method used:   Electronically to        [facility] (retail)       [address]       [place], [place]  [address]       Ph: [phone]       Fax: [phone]   RxID:   [id number]    Orders Added: 1)  [facility]- Est  Level  4 [60454]     Prevention & Chronic Care Immunizations   Influenza vaccine: Fluvax 3+  (05/30/2010)    Tetanus booster: 05/24/2009: Tdap    Pneumococcal vaccine: Pneumovax  (07/05/2009)  Other Screening   Smoking status: never  (06/27/2010)  Diabetes Mellitus   HgbA1C: 5.9  (05/30/2010)    Eye exam: Not documented    Foot exam: yes  (12/13/2009)   Foot exam action/deferral: Do today   High risk foot: No  (12/13/2009)   Foot care education: Done  (12/13/2009)    Urine microalbumin/creatinine ratio: Not documented    Diabetes flowsheet reviewed?: Yes   Progress toward A1C goal: At goal  Lipids   Total Cholesterol: 221  (06/20/2010)   LDL: 128  (06/20/2010)   LDL Direct: 162  (03/22/2009)   HDL: 72  (06/20/2010)   Triglycerides: 105  (06/20/2010)    SGOT (AST): 20  (06/14/2009)   SGPT (ALT): 19  (06/14/2009)   Alkaline phosphatase: 75  (06/14/2009)   Total bilirubin: 0.9  (06/14/2009)   Liver panel due: 06/30/2010    Lipid flowsheet reviewed?: Yes   Progress toward LDL goal: Deteriorated  Hypertension   Last Blood Pressure: 131 / 83  (06/27/2010)   Serum creatinine: 0.73  (06/20/2010)   Serum potassium 4.1  (06/20/2010)    Hypertension flowsheet reviewed?: Yes   Progress  toward BP goal: Improved  Self-Management Support :   Personal Goals (by the next clinic visit) :     Personal A1C goal: 7  (04/19/2009)     Personal blood pressure goal: 130/80  (04/19/2009)     Personal LDL goal: 70  (04/19/2009)    Diabetes self-management support: Written self-care plan  (04/19/2009)    Hypertension self-management support: Written self-care plan  (04/19/2009)    Lipid self-management support: Written self-care plan  (04/19/2009)   .I interviewed and examined this patient and discussed his care plan with Dr. Jeanice Lim

## 2010-09-06 NOTE — Assessment & Plan Note (Signed)
Summary: neck pain,tcb   Vital Signs:  Patient profile:   50 year old male Weight:      135 pounds Pulse rate:   94 / minute BP sitting:   134 / 70  (right arm)  Vitals Entered By: Arlyss Repress CMA, (Dec 13, 2009 3:53 PM) CC: left leg pain x 15 days Is Patient Diabetic? Yes Pain Assessment Patient in pain? yes     Location: left leg Intensity: 3 Onset of pain  x15d   Primary Care Provider:  Dr. Sheffield Slider  CC:  left leg pain x 15 days.  History of Present Illness: Visit conducted in [place].   Jeffrey Gray comes in today with a complaint of L lower leg pain for the past 2 weeks, onset within a day after sustaining a minor injury with a trash can along the lateral aspect of leg just distal to the knee.  He has a history of ortho surgery with rods and screws in Nov 2009 after sustaining a fracture to that leg.   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  He often works seven days a week, is on his feet for long stretches of the day.  WIll occasionally have some swelling in ankles at the end of the day.  Denies calf pain or reduced ROM in knees, ankles.  Denies warmth of L lower leg.  Denies recent period of immobility.    Continues to take his Norvasc and other meds at the same dose as in Med List.   Regarding his DM, it is noted that he has had excellent A1C results over the past 2 visits.  We have decided to postpone his next A1C to 6 months from his most recent one.   Denies any allergies to medications, denies gastric ulcer or GI bleeding.  Has not been having chest pain recently.  The notes from his recent visit to Cardiology are reviewed and noted.   He reports some tooth pain and a broken L lower molar, does not have plans for dentist visit.   Habits & Providers  Alcohol-Tobacco-Diet     Tobacco Status: never  Current Medications (verified): 1)  Pravastatin Sodium 40 Mg Tabs (Pravastatin Sodium) .Marland Kitchen.. 1 Tablet Daily At Bedtime For Cholesterol.  Instructions in Spanish 2)  Metformin Hcl 1000 Mg Tabs (Metformin Hcl) .... Tomar 1/2  Tab Po Qd   For Diabetes 3)  Metoprolol Tartrate 25 Mg Tabs (Metoprolol Tartrate) .Marland Kitchen.. 1 Tablet Twice A Day 4)  Nitrostat 0.4 Mg Subl (Nitroglycerin) .Marland Kitchen.. 1 Under Your Tongue As Needed For Chest Pain-You May Take 1 Every 5 Minutes X 3 5)  Amlodipine Besylate 5 Mg Tabs (Amlodipine Besylate) .... One and One-Half Tablets Daily 6)  Aspirin 81 Mg Tbec (Aspirin) .Marland Kitchen.. 1 Daily 7)  Zestoretic 20-12.5 Mg Tabs (Lisinopril-Hydrochlorothiazide) .... Take One Tablet Daily 8)  Naproxen 500 Mg Tabs (Naproxen) .... Sig: Take 1 Tab By Mouth Every 12 Hours With Food, For 10 Days Spanish Instructions  Allergies (verified): No Known Drug Allergies  Social History: Reviewed history from 05/24/2009 and no changes required. Llives with wife and 3 children, also has a grown son.   Works Administrator buildings.   Education 10th grade Never smoked.   Drank heavily in the past but has cut back.  Moved here from [place] about 10 years from [place].    Physical Exam  General:  well appearing, no apparent distress. Mouth:  clear oropharynx.  Moist mucus  membranes.  L lower molar in poor condition.  Neck:  No deformities, masses, or tenderness noted.  No cervical adenopathy noted.  Lungs:  Normal respiratory effort, chest expands symmetrically. Lungs are clear to auscultation, no crackles or wheezes. Heart:  Normal rate and regular rhythm. S1 and S2 normal without gallop, murmur, click, rub or other extra sounds. Pulses:  palpable dp pulses bilaterally.  Extremities:  Full active and passive ROM of both knees.  Negative drawer test bilat. Negative for calor, rubor or tumor of either leg.  Calf diameter measured at 10cm below tibial tuberosity is 32cm bilaterally and symmetric.  Negative Homans sign.  No cords noted on exam.  Neurologic:  Walks without assistance, normal gait.   Diabetes Management Exam:    Foot Exam (with  socks and/or shoes not present):       Sensory-Pinprick/Light touch:          Left medial foot (L-4): normal          Left dorsal foot (L-5): normal          Left lateral foot (S-1): normal          Right medial foot (L-4): normal          Right dorsal foot (L-5): normal          Right lateral foot (S-1): normal       Sensory-Monofilament:          Left foot: normal          Right foot: normal       Inspection:          Left foot: normal          Right foot: normal       Nails:          Left foot: normal          Right foot: normal   Impression & Recommendations:  Problem # 1:  LEG PAIN, LEFT (ICD-729.5)  Acute onset associated with minor injury.  No findings or history to support substantial injury or VTE.  The patient walks and stands a lot for his occupation, and I suspect that his lack of ability to rest is largely responsible for his persistent discomfort.  Discussed the short-term use of NSAID, along with elevation when he can, and rest when permitted.  He workswith his wife, and he hopes she will be able to cover some of his functions at work that require being on his feet.  He wears supportive marathon-running shoes, and says these are supportive and comfortable.  He is instructed to call our office if he still experiences the same pain in 1week's time.   Orders: [facility]- Est  Level 4 (38756)  Problem # 2:  DIABETES MELLITUS, TYPE II (ICD-250.00)  Decision for today to schedule A1C biannually.  His next one will thus be due in August or September.  He is to continue same medication regimen.  He is still taking the ASA 81mg  daily.  His updated medication list for this problem includes:    Metformin Hcl 1000 Mg Tabs (Metformin hcl) .Marland Kitchen... Tomar 1/2  tab po qd   for diabetes    Aspirin 81 Mg Tbec (Aspirin) .Marland Kitchen... 1 daily    Zestoretic 20-12.5 Mg Tabs (Lisinopril-hydrochlorothiazide) .Marland Kitchen... Take one tablet daily  Orders: [facility]- Est  Level 4 (43329)  Problem # 3:  HYPERTENSION  (ICD-401.9)  At goal today.  He is to continue with same regimen.  His updated  medication list for this problem includes:    Metoprolol Tartrate 25 Mg Tabs (Metoprolol tartrate) .Marland Kitchen... 1 tablet twice a day    Amlodipine Besylate 5 Mg Tabs (Amlodipine besylate) ..... One and one-half tablets daily    Zestoretic 20-12.5 Mg Tabs (Lisinopril-hydrochlorothiazide) .Marland Kitchen... Take one tablet daily  Orders: [facility]- Est  Level 4 (04540)  Complete Medication List: 1)  Pravastatin Sodium 40 Mg Tabs (Pravastatin sodium) .Marland Kitchen.. 1 tablet daily at bedtime for cholesterol. instructions in spanish 2)  Metformin Hcl 1000 Mg Tabs (Metformin hcl) .... Tomar 1/2  tab po qd   for diabetes 3)  Metoprolol Tartrate 25 Mg Tabs (Metoprolol tartrate) .Marland Kitchen.. 1 tablet twice a day 4)  Nitrostat 0.4 Mg Subl (Nitroglycerin) .Marland Kitchen.. 1 under your tongue as needed for chest pain-you may take 1 every 5 minutes x 3 5)  Amlodipine Besylate 5 Mg Tabs (Amlodipine besylate) .... One and one-half tablets daily 6)  Aspirin 81 Mg Tbec (Aspirin) .Marland Kitchen.. 1 daily 7)  Zestoretic 20-12.5 Mg Tabs (Lisinopril-hydrochlorothiazide) .... Take one tablet daily 8)  Naproxen 500 Mg Tabs (Naproxen) .... Sig: take 1 tab by mouth every 12 hours with food, for 10 days spanish instructions  Patient Instructions: 1)  Fue un placer atenderle hoy.  Creo que el dolor de la pierna izquierda se debe al golpe que sostuvo hace 2 semanas.  2)  Recomiendo que use una medicina anti-inflamatoria como la Naproxen 500mg , tome una tableta cada 12 horas (o sea, 2 veces por dia) con algo de comer.  No es bueno tomarla por largo rato, por eso le recomiendo que se la tome por 2 semanas y Surveyor, quantity. 3)  Eleve la pierna izquierda cuando no esta' trabajando, o al final del dia de [place]. 4)  Por favor haganos contacto si no se le mejora en la proxima semana. 5)  MAKE FOLLOW UP APPT WITH DR HALE IN JULY OR AUGUST FOR DM FOLLOWUP. Prescriptions: NAPROXEN 500 MG TABS (NAPROXEN)  SIG: Take 1 tab by mouth every 12 hours with food, for 10 days Spanish instructions  #60 x 0   Entered and Authorized by:   Paula Compton MD   Signed by:   Paula Compton MD on 12/13/2009   Method used:   Electronically to        [facility] (retail)       [address]       [place], [place]  [address]       Ph: [phone]       Fax: [phone]   RxID:   [id number]    Diabetic Foot Exam Foot Inspection Is there a history of a foot ulcer?              No Is there a foot ulcer now?              No Can the patient see the bottom of their feet?          Yes Are the shoes appropriate in style and fit?          Yes Is there swelling or an abnormal foot shape?          No Are the toenails long?                No Are the toenails thick?                No Are the toenails ingrown?  No Is there heavy callous build-up?              No Is there pain in the calf muscle (Intermittent claudication) when walking?    NoIs there a claw toe deformity?              No Is there elevated skin temperature?            No Is there limited ankle dorsiflexion?            No Is there foot or ankle muscle weakness?            No  Diabetic Foot Care Education Patient educated on appropriate care of diabetic feet.  Pulse Check          Right Foot          Left Foot Posterior Tibial:        normal            normal Dorsalis Pedis:        normal            normal  High Risk Feet? No   10-g (5.07) Semmes-Weinstein Monofilament Test Performed by: Paula Compton MD          Right Foot          Left Foot Visual Inspection               Test Control      normal         normal Site 1         normal         normal Site 2         normal         normal Site 3         normal         normal Site 4         normal         normal Site 5         normal         normal Site 6         normal         normal Site 7         normal         normal Site 8         normal          normal Site 9         normal         normal Site 10         normal         normal  Impression      normal         normal   Prevention & Chronic Care Immunizations   Influenza vaccine: Fluvax Non-MCR  (05/24/2009)    Tetanus booster: 05/24/2009: Tdap    Pneumococcal vaccine: Pneumovax  (07/05/2009)  Other Screening   Smoking status: never  (12/13/2009)  Diabetes Mellitus   HgbA1C: 5.6  (09/20/2009)    Eye exam: Not documented    Foot exam: yes  (12/13/2009)   Foot exam action/deferral: Do today   High risk foot: No  (12/13/2009)   Foot care education: Done  (12/13/2009)    Urine microalbumin/creatinine ratio: Not documented    Diabetes flowsheet reviewed?: Yes   Progress toward A1C goal: At goal  Lipids   Total Cholesterol: 153  (06/14/2009)  LDL: 93  (06/14/2009)   LDL Direct: 162  (03/22/2009)   HDL: 47.80  (06/14/2009)   Triglycerides: 62.0  (06/14/2009)    SGOT (AST): 20  (06/14/2009)   SGPT (ALT): 19  (06/14/2009)   Alkaline phosphatase: 75  (06/14/2009)   Total bilirubin: 0.9  (06/14/2009)    Lipid flowsheet reviewed?: Yes   Progress toward LDL goal: At goal  Hypertension   Last Blood Pressure: 134 / 70  (12/13/2009)   Serum creatinine: 0.70  (08/23/2009)   Serum potassium 4.6  (08/23/2009)    Hypertension flowsheet reviewed?: Yes   Progress toward BP goal: At goal  Self-Management Support :   Personal Goals (by the next clinic visit) :     Personal A1C goal: 7  (04/19/2009)     Personal blood pressure goal: 130/80  (04/19/2009)     Personal LDL goal: 70  (04/19/2009)    Diabetes self-management support: Written self-care plan  (04/19/2009)    Hypertension self-management support: Written self-care plan  (04/19/2009)    Lipid self-management support: Written self-care plan  (04/19/2009)    Nursing Instructions: Diabetic foot exam today

## 2010-09-06 NOTE — Assessment & Plan Note (Signed)
Summary: 6 month rov/sl   Primary Provider:  Dr. Sheffield Slider  CC:  6 month follow up.  Pt feeling well.  Pt has been feeling a pain in throat since yesterday.  Jeffrey Gray  History of Present Illness: 50 yo with history of possible coronary vasospasm, diabetes, HTN presents for followup.  He has been doing reasonably well.  He says that when he gets excited (watching a soccer match, etc) he will feel his heart pounding.  However, no further chest pain/tightness.  His BP continues to run high, especially given his history of diabetes.  No exertional dyspnea.  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    Labs (11/10): LDL 93, HDL 48  ECG: NSR, normal  Current Medications (verified): 1)  Pravastatin Sodium 40 Mg Tabs (Pravastatin Sodium) .Jeffrey Gray.. 1 Tablet Daily At Bedtime For Cholesterol. Instructions in Spanish 2)  Metformin Hcl 1000 Mg Tabs (Metformin Hcl) .... Tomar 1/2  Tab Po Qd   For Diabetes 3)  Metoprolol Tartrate 25 Mg Tabs (Metoprolol Tartrate) .Jeffrey Gray.. 1 Tablet Twice A Day 4)  Nitrostat 0.4 Mg Subl (Nitroglycerin) .Jeffrey Gray.. 1 Under Your Tongue As Needed For Chest Pain-You May Take 1 Every 5 Minutes X 3 5)  Norvasc 5 Mg Tabs (Amlodipine Besylate) .... Take One Tablet Daily 6)  Aspirin 81 Mg Tbec (Aspirin) .Jeffrey Gray.. 1 Daily 7)  Zestoretic 20-12.5 Mg Tabs (Lisinopril-Hydrochlorothiazide) .... Take One Tablet Daily  Allergies (verified): No Known Drug Allergies  Past History:  Past Medical History: Reviewed history from 11/12/2009 and no changes required. 1. HTN 2. alcohol abuse vs dependence 3. Chest pain: ETT (9/10) equivocal (8.5 METS, nonspecific changes but chest pain with exertion).  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 EF 65%. There was, however, noted to be significant vasospasm in the left main and LAD relieved by intracoronary nitroglycerine.  He is now on amlodipine for treatment of possible coronary vasospasm.  4. DM2 5. Hyperlipidemia  Family  History: Reviewed history from 04/20/2009 and no changes required. father--MI at 42s, htn, DM, "colon problem" mother--htn, some type of liver problem brother--MI at 73  Social History: Reviewed history from 05/24/2009 and no changes required. Llives with wife and 3 children, also has a grown son.   Works Administrator buildings.   Education 10th grade Never smoked.   Drank heavily in the past but has cut back.  Moved here from [place] about 10 years from [place].    Review of Systems       All systems reviewed and negative except as per HPI with the exception of some upper neck pain (mild)  Vital Signs:  Patient profile:   50 year old male Height:      62 inches Weight:      136 pounds BMI:     24.96 Pulse rate:   69 / minute Pulse rhythm:   regular BP sitting:   142 / 84  (left arm) Cuff size:   regular  Vitals Entered By: Judithe Modest CMA (November 15, 2009 9:01 AM)  Physical Exam  General:  Well developed, well nourished, in no acute distress. Neck:  Neck supple, no JVD. No masses, thyromegaly or abnormal cervical nodes. Unable to find any abnormality in upper neck.  Patient states that it hurts a little when he pushes hard.  Lungs:  Clear bilaterally to auscultation and percussion. Heart:  Non-displaced PMI, chest non-tender; regular rate and rhythm, S1, S2 without murmurs, rubs or gallops. Carotid  upstroke normal, no bruit. Pedals normal pulses. No edema, no varicosities. Abdomen:  Bowel sounds positive; abdomen soft and non-tender without masses, organomegaly, or hernias noted. No hepatosplenomegaly. Extremities:  No clubbing or cyanosis. Neurologic:  Alert and oriented x 3. Psych:  Normal affect.   Impression & Recommendations:  Problem # 1:  CORONARY ARTERY SPASM (ICD-413.9) Possible coronary vasospasm as explanation of patient's prior chest pain.  This seems to have resolved with amlodipine use.    Problem # 2:  HYPERLIPIDEMIA (ICD-272.4) Lipids ok when  last checked.   Problem # 3:  HYPERTENSION (ICD-401.9) BP still running high.  Will increase Norvasc to 7.5 mg daily.   Problem # 4:  NECK PAIN Benign exam, no nodes or mass.  No dysphagia.  Likely not related to heart either.  If continues to be a problem, followup with Dr. Sheffield Slider.   Other Orders: EKG w/ Interpretation (93000)  Patient Instructions: 1)  Your physician has recommended you make the following change in your medication:  2)  Increase Amlodipine to 7.5 mg daily--this will be one and one-half of a 5mg  tablet. 3)  Your physician wants you to follow-up in: 1 year with Dr Shirlee Latch.   You will receive a reminder letter in the mail two months in advance. If you don't receive a letter, please call our office to schedule the follow-up appointment. Prescriptions: AMLODIPINE BESYLATE 5 MG TABS (AMLODIPINE BESYLATE) one and one-half tablets daily  #50 x 11   Entered by:   Katina Dung, RN, BSN   Authorized by:   Marca Ancona, MD   Signed by:   Katina Dung, RN, BSN on 11/15/2009   Method used:   Electronically to        [facility] (retail)       [address]       [place], [place]  [address]       Ph: [phone]       Fax: [phone]   RxID:   [id number]

## 2010-09-26 ENCOUNTER — Encounter: Payer: Self-pay | Admitting: Family Medicine

## 2010-09-26 ENCOUNTER — Ambulatory Visit (INDEPENDENT_AMBULATORY_CARE_PROVIDER_SITE_OTHER): Payer: Self-pay | Admitting: Family Medicine

## 2010-09-26 DIAGNOSIS — E119 Type 2 diabetes mellitus without complications: Secondary | ICD-10-CM

## 2010-09-26 DIAGNOSIS — E785 Hyperlipidemia, unspecified: Secondary | ICD-10-CM

## 2010-09-26 DIAGNOSIS — I10 Essential (primary) hypertension: Secondary | ICD-10-CM

## 2010-09-26 DIAGNOSIS — I209 Angina pectoris, unspecified: Secondary | ICD-10-CM

## 2010-09-26 DIAGNOSIS — F1010 Alcohol abuse, uncomplicated: Secondary | ICD-10-CM

## 2010-09-26 LAB — LDL CHOLESTEROL, DIRECT: Direct LDL: 101 mg/dL — ABNORMAL HIGH

## 2010-09-26 NOTE — Assessment & Plan Note (Signed)
Intake greater than recommended. Advised decreasing intake. Pt would like to cut back on his own, will contact us if he would like help.

## 2010-09-26 NOTE — Assessment & Plan Note (Addendum)
Previously elevated. Goal LDL <100 Checking dLDL, CMET.

## 2010-09-26 NOTE — Assessment & Plan Note (Signed)
Pt with previously well controlled DM2. Compliant with metformin. Checking A1c, urine microalb. Pt to return to eye MD for funduscopic exam.

## 2010-09-26 NOTE — Patient Instructions (Signed)
Great to see you, Try to decrease your alcohol intake to no more than 24 oz of beer daily. Checking bloodwork. Come back to see [place] in 6 months, we will call you with results if anything is abnormal.  -Dr. Karie Schwalbe.

## 2010-09-26 NOTE — Progress Notes (Signed)
  Subjective:    Patient ID: Jeffrey Gray, male    DOB: [birth year], 50 y.o.   MRN: [id number]  HPI HTN: Compliant with BP meds, has not used any NTG.    HLD: Has been taking pravachol as directed.  Last LDL uncontrolled.    DM2: Taking metformin as directed.  Checks feet daily.  ETOH Abuse: Currently consuming approx 2- 24oz beers daily.  Occasionally has a drink in the morning, feels guilty about drinking, not annoyed when people ask, has tried to cut back.  Wants to cut back on his own.  Review of Systems    Neg except as in HPI Objective:   Physical Exam  Constitutional: He appears well-developed and well-nourished. No distress.  HENT:  Head: Normocephalic and atraumatic.  Left Ear: External ear normal.  Nose: Nose normal.  Mouth/Throat: Oropharynx is clear and moist.  Eyes: Conjunctivae and EOM are normal. Pupils are equal, round, and reactive to light. Right eye exhibits no discharge. Left eye exhibits no discharge. No scleral icterus.       Cerumen in R EAM  Neck: Normal range of motion. Neck supple. No tracheal deviation present. No thyromegaly present.  Cardiovascular: Normal rate and regular rhythm.  Exam reveals no gallop.   No murmur heard. Pulmonary/Chest: Effort normal and breath sounds normal. No respiratory distress. He has no wheezes. He has no rales. He exhibits no tenderness.  Musculoskeletal: He exhibits no edema.       Diabetic monofilament exam unremarkable. No abnormal callous noted.  Lymphadenopathy:    He has no cervical adenopathy.  Skin: Skin is warm and dry. He is not diaphoretic.          Assessment & Plan:

## 2010-09-26 NOTE — Assessment & Plan Note (Signed)
>>  ASSESSMENT AND PLAN FOR CORONARY ARTERY SPASM WRITTEN ON 09/26/2010  3:16 PM BY Monica Becton, MD  Note pt was on norvasc but was paying >$100 a month for this and stopped due to financial concerns. Note amlodipine is available on the [employer] list for $4 for a month or $11 for 3 months.

## 2010-09-26 NOTE — Assessment & Plan Note (Addendum)
At goal, no changes needed.

## 2010-09-26 NOTE — Assessment & Plan Note (Signed)
Note pt was on norvasc but was paying >$100 a month for this and stopped due to financial concerns. Note amlodipine is available on the [employer] list for $4 for a month or $11 for 3 months.

## 2010-09-27 LAB — COMPREHENSIVE METABOLIC PANEL
ALT: 54 U/L — ABNORMAL HIGH (ref 0–53)
Albumin: 4.8 g/dL (ref 3.5–5.2)
Calcium: 9.8 mg/dL (ref 8.4–10.5)
Chloride: 96 mEq/L (ref 96–112)
Glucose, Bld: 154 mg/dL — ABNORMAL HIGH (ref 70–99)
Potassium: 4 mEq/L (ref 3.5–5.3)
Sodium: 135 mEq/L (ref 135–145)
Total Bilirubin: 0.3 mg/dL (ref 0.3–1.2)

## 2010-09-28 ENCOUNTER — Encounter: Payer: Self-pay | Admitting: Family Medicine

## 2010-10-08 ENCOUNTER — Encounter: Payer: Self-pay | Admitting: Family Medicine

## 2010-10-10 ENCOUNTER — Encounter: Payer: Self-pay | Admitting: Family Medicine

## 2010-10-10 ENCOUNTER — Ambulatory Visit (INDEPENDENT_AMBULATORY_CARE_PROVIDER_SITE_OTHER): Payer: Self-pay | Admitting: Family Medicine

## 2010-10-10 VITALS — BP 134/78 | Temp 98.3°F | Ht 60.63 in | Wt 137.0 lb

## 2010-10-10 DIAGNOSIS — E785 Hyperlipidemia, unspecified: Secondary | ICD-10-CM

## 2010-10-10 DIAGNOSIS — E119 Type 2 diabetes mellitus without complications: Secondary | ICD-10-CM

## 2010-10-10 DIAGNOSIS — I2510 Atherosclerotic heart disease of native coronary artery without angina pectoris: Secondary | ICD-10-CM

## 2010-10-10 DIAGNOSIS — F1010 Alcohol abuse, uncomplicated: Secondary | ICD-10-CM

## 2010-10-10 NOTE — Patient Instructions (Addendum)
Regrese en 3 meses para otro chequeo de azucar  Camine 20 - 30 minutes 4 veces a la semana. Walk 20-30 minutes 4 times a week  Llame Dr Sheffield Slider si tiene sintomas de azucar bajo   Return in 3 months for diabetes follow-up.

## 2010-10-10 NOTE — Progress Notes (Signed)
  Subjective:    Patient ID: Jeffrey Gray, male    DOB: 06-11-1961, 50 y.o.   MRN: [id number]  HPIHe has been feeling well without hypoglycemic symptoms. Continues to drink at times with his friends    Review of Systems     Objective:   Physical Exam  Constitutional: He appears well-developed and well-nourished.  Pulmonary/Chest: Effort normal.  Skin: Skin is warm and dry.  Psychiatric: He has a normal mood and affect. His behavior is normal.          Assessment & Plan:

## 2010-10-10 NOTE — Assessment & Plan Note (Signed)
No recent chest pain, hasn't taken .ntg recently But hasn't been exercising much

## 2010-10-10 NOTE — Assessment & Plan Note (Signed)
LDL nearly at goal of <100. Will continue same dose

## 2010-10-10 NOTE — Assessment & Plan Note (Signed)
Diabetes mellitus very well controlled. Warned about hypoglycemic sx

## 2010-10-10 NOTE — Assessment & Plan Note (Signed)
Reviewed his elevated LFT with him and strongly advised him to avoid alcohol

## 2010-10-28 ENCOUNTER — Other Ambulatory Visit: Payer: Self-pay | Admitting: Family Medicine

## 2010-10-28 NOTE — Telephone Encounter (Signed)
Refill request

## 2010-11-11 LAB — GLUCOSE, CAPILLARY: Glucose-Capillary: 114 mg/dL — ABNORMAL HIGH (ref 70–99)

## 2010-11-21 ENCOUNTER — Ambulatory Visit (INDEPENDENT_AMBULATORY_CARE_PROVIDER_SITE_OTHER): Payer: Self-pay | Admitting: Family Medicine

## 2010-11-21 ENCOUNTER — Encounter: Payer: Self-pay | Admitting: Family Medicine

## 2010-11-21 DIAGNOSIS — H9311 Tinnitus, right ear: Secondary | ICD-10-CM

## 2010-11-21 DIAGNOSIS — B353 Tinea pedis: Secondary | ICD-10-CM

## 2010-11-21 DIAGNOSIS — E119 Type 2 diabetes mellitus without complications: Secondary | ICD-10-CM

## 2010-11-21 DIAGNOSIS — I2510 Atherosclerotic heart disease of native coronary artery without angina pectoris: Secondary | ICD-10-CM

## 2010-11-21 DIAGNOSIS — M79609 Pain in unspecified limb: Secondary | ICD-10-CM

## 2010-11-21 DIAGNOSIS — I10 Essential (primary) hypertension: Secondary | ICD-10-CM

## 2010-11-21 DIAGNOSIS — H9319 Tinnitus, unspecified ear: Secondary | ICD-10-CM

## 2010-11-21 MED ORDER — METOPROLOL TARTRATE 25 MG PO TABS: 25.0000 mg | ORAL_TABLET | Freq: Two times a day (BID) | ORAL | Status: DC

## 2010-11-21 NOTE — Progress Notes (Signed)
  Subjective:    Patient ID: Jeffrey Gray, male    DOB: March 13, 1961, 50 y.o.   MRN: [id number]  HPI Nathanie is feeling well but admits to drinking 6-7 beers this past Saturday, 2 on Sunday and none on work days. He has been working in [employer] but does not yet have [employer] he has been taking all his medications but needs a refill on his metoprolol he realizes he has rash between his toes but has no symptoms with this.     Review of Systems     Objective:   Physical Exam Chest clear Heart regular rhythm Feet have superficial desquamation on plantar surface and between toes       Assessment & Plan:

## 2010-11-21 NOTE — Assessment & Plan Note (Signed)
His leg pain has resolved

## 2010-11-21 NOTE — Assessment & Plan Note (Signed)
Tinnitus due to R canal cerumen. Consider underlying noise exposure hearing loss. To apply wax softening drops and flush the ear is neccessary

## 2010-11-21 NOTE — Patient Instructions (Signed)
Return in 4 months for office visit and A1c  Buy miconazole, clotrimazole or terbinafine dos veces cada dia en el salpullido de los pies

## 2010-11-21 NOTE — Assessment & Plan Note (Signed)
Elevated because he didn't take his blood pressure medication this AM. Needs Metoprolol. Last BP at pharmacy 115/70

## 2010-11-21 NOTE — Assessment & Plan Note (Signed)
No recent chest pain he has taken no nitroglycerin

## 2010-11-21 NOTE — Assessment & Plan Note (Signed)
His diabetes has been well-controlled and foot diabetic foot exam is normal except for the tinea pedis. We'll space his A1c testing to every 6 months. Continue medications the same

## 2010-11-29 ENCOUNTER — Encounter: Payer: Self-pay | Admitting: Cardiology

## 2010-11-29 ENCOUNTER — Ambulatory Visit (INDEPENDENT_AMBULATORY_CARE_PROVIDER_SITE_OTHER): Payer: Self-pay | Admitting: Cardiology

## 2010-11-29 DIAGNOSIS — E785 Hyperlipidemia, unspecified: Secondary | ICD-10-CM

## 2010-11-29 DIAGNOSIS — I10 Essential (primary) hypertension: Secondary | ICD-10-CM

## 2010-11-29 DIAGNOSIS — I209 Angina pectoris, unspecified: Secondary | ICD-10-CM

## 2010-11-30 NOTE — Progress Notes (Signed)
PCP: Dr. Sheffield Slider  50 yo with history of possible coronary vasospasm, diabetes, HTN presents for followup.  He has had no episodes of chest pain since I last saw him.  I had him on amlodipine to prevent vasospasm.  He stopped it due to expense but has not had any symptoms after coming off it.  Blood pressure and lipids are reasonably controlled.  No exertional dyspnea.  He was laid off since I last saw him.   Labs (11/10): LDL 93, HDL 48 Labs (2/12): LDL 101, K 4, creatinine 0.88  ECG: NSR, normal  Allergies (verified):  No Known Drug Allergies  Past Medical History: 1. HTN 2. alcohol abuse vs dependence 3. Chest pain: ETT (9/10) equivocal (8.5 METS, nonspecific changes but chest pain with exertion).  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 EF 65%. There was, however, noted to be significant vasospasm in the left main and LAD relieved by intracoronary nitroglycerine.  Treated in the past with amlodipine for vasospasm. 4. DM2 5. Hyperlipidemia  Family History: father--MI at 79s, htn, DM, "colon problem" mother--htn, some type of liver problem brother--MI at 66  Social History: Lives with wife and 3 children, also has a grown son.  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   Drank heavily in the past but has cut back.  Moved here from [place] about 10 years from [place].    Current Outpatient Prescriptions  Medication Sig Dispense Refill  . aspirin (ASPIRIN LOW DOSE) 81 MG EC tablet Take 81 mg by mouth daily.        Marland Kitchen lisinopril-hydrochlorothiazide (ZESTORETIC) 20-12.5 MG per tablet Take 2 tablets by mouth daily.       . metFORMIN (GLUCOPHAGE) 1000 MG tablet TAKE ONE-HALF TABLET BY MOUTH EVERY DAY FOR DIABETES  30 tablet  11  . metoprolol tartrate (LOPRESSOR) 25 MG tablet Take 1 tablet (25 mg total) by mouth 2 (two) times daily.  60 tablet  11  . nitroGLYCERIN (NITROSTAT) 0.4 MG SL tablet Place 0.4 mg under the tongue every 5 (five) minutes as needed.        . pravastatin (PRAVACHOL) 40 MG tablet Take 40 mg by mouth at bedtime. for cholesterol. Instructions in spanish        BP 128/80  Pulse 63  Resp 18  Ht 5\' 1"  (1.549 m)  Wt 137 lb 12.8 oz (62.506 kg)  BMI 26.04 kg/m2 General: NAD Neck: No JVD, no thyromegaly or thyroid nodule.  Lungs: Clear to auscultation bilaterally with normal respiratory effort. CV: Nondisplaced PMI.  Heart regular S1/S2, no S3/S4, no murmur.  No peripheral edema.  No carotid bruit.  Normal pedal pulses.  Abdomen: Soft, nontender, no hepatosplenomegaly, no distention.  Neurologic: Alert and oriented x 3.  Psych: Normal affect. Extremities: No clubbing or cyanosis.

## 2010-11-30 NOTE — Assessment & Plan Note (Signed)
Good BP control.

## 2010-11-30 NOTE — Assessment & Plan Note (Signed)
Last LDL was 101.  Goal LDL at least < 100 with diabetes so close.  Encouraged dietary changes and exercise.

## 2010-11-30 NOTE — Assessment & Plan Note (Signed)
>>  ASSESSMENT AND PLAN FOR CORONARY ARTERY SPASM WRITTEN ON 11/30/2010 11:59 AM BY MCLEAN, Eliot Ford, MD  Has had episodes of chest pain in the past thought to be due to coronary vasospasm (significant spasm seen at time of coronary angiogram).  He had been on amlodipine but stopped it due to expense.  He has not noted increased symptoms since stopping this medication.  If he gets future chest pain episodes, would restart amlodipine.

## 2010-11-30 NOTE — Assessment & Plan Note (Signed)
Has had episodes of chest pain in the past thought to be due to coronary vasospasm (significant spasm seen at time of coronary angiogram).  He had been on amlodipine but stopped it due to expense.  He has not noted increased symptoms since stopping this medication.  If he gets future chest pain episodes, would restart amlodipine.

## 2010-12-20 NOTE — Op Note (Signed)
NAMEKOSTANTINOS, TALLMAN NO.:  [id number]   MEDICAL RECORD NO.:  [id number]          PATIENT TYPE:  INP   LOCATION:  0098                         FACILITY:  [facility]   PHYSICIAN:  Vanita Panda. Magnus Ivan, M.D.DATE OF BIRTH:  [birth year]   DATE OF PROCEDURE:  06/22/2008  DATE OF DISCHARGE:                               OPERATIVE REPORT   PREOPERATIVE DIAGNOSIS:  Left distal third tibia/fibular fracture.   POSTOPERATIVE DIAGNOSIS:  Left distal third tibia/fibular fracture.   PROCEDURE:  Intramedullary nail placement, left tibia.   IMPLANTS:  Smith & Nephew 8.5 x 30 tibial nail with 1 proximal and 2  distal interlocks.   SURGEON:  Vanita Panda. Magnus Ivan, M.D.   ANESTHESIA:  General.   ANTIBIOTICS:  Ancef 1 gm IV.   BLOOD LOSS:  100 cc.   COMPLICATIONS:  None.   INDICATIONS:  Briefly, Mr. Hoog is a 50 year old male who yesterday  was slightly inebriated and playing soccer when he fell and sustained an  injury to his left tibia.  He was seen at the [facility] emergency room  and found to have a mildly displaced distal third tibia/fibular fracture  of the left leg.  He had no evidence of compartment syndrome, and he was  placed in a splint and was comfortable.  I admitted him for surgery  within the next 24 hours to allow him to sober up as well as due to the  fact that he had eaten a full meal.  He now presents for definitive  dictation of the tibia.  He is non-English-speaking, but it has been  explained to him and his family through an interpreter, what we need to  do to stabilize the tibia.  He understands the need for surgery.   PROCEDURAL DESCRIPTION:  After informed consent was obtained and the  left leg was marked, he was brought to the operating room and placed  supine on the operating room table.  This was a radiolucent operating  room table.  General anesthesia was then obtained.  His left lower leg  was then prepped and draped with DuraPrep and  sterile drapes from the  mid thigh down to the toes.  At the time I was called, he was identified  as the correct patient and correct left leg.  With the bed raised and  the radiolucent triangle placed underneath the knee, a small incision  was made at the inferior pole of the patella and carried distally.  I  dissected down to the patellar tendon and divided the paratenon and then  took a medial approach to the knee.  Just off the medial edge of the  patellar tendon, I dissected to the deep tissues, and I was able to gain  access to the proximal portion of the tibia.  A guidepin was then  inserted under direct fluoroscopic guidance in the center position of  the tibia.  Once the pin was placed in an antegrade fashion, an  initiating reamer was used to open up the tibial canal.  A guidewire was  then placed in an antegrade fashion, crossing the fracture site, when  it  was held reduced in placement to the proximity to the ankle.  I then  made a measurement off this guide pin, and an 30 mm length nail was  chosen.  We then started off with the reaming with an 8 mm reamer up to  a 9.5 mm reamer.  After abundant chatter, we felt that a size 8 nail  would provide the best fit.  The size 8.5 x 30 Smith & Nephew tibial  nail was then placed over the guidewire.  Once it passed the fracture  site, the guidewire was removed.  I then secured the rod with 1 proximal  interlock through a separate stab incision under radiographic guidance,  followed by 2 distal interlocks through small stab incisions from a  medial-to-lateral direction.  All wounds were then copiously irrigated.  I removed the tibial guide.  I closed the deep tissue at the knee with 0  Vicryl followed by a 2-0 Vicryl in the subcutaneous tissue and  interrupted staples on the skin as well as the screw sites.  A well-  padded sterile dressing was then applied, then the patient was awakened,  extubated, and taken to the recovery room in  stable condition.  All  final counts were correct.  There were no complications noted.      Vanita Panda. Magnus Ivan, M.D.  Electronically Signed     CYB/MEDQ  D:  06/22/2008  T:  06/23/2008  Job:  [id number]

## 2010-12-23 NOTE — Discharge Summary (Signed)
NAMEHAN, VEJAR            ACCOUNT NO.:  [id number]   MEDICAL RECORD NO.:  [id number]          PATIENT TYPE:  INP   LOCATION:  1619                         FACILITY:  [facility]   PHYSICIAN:  Vanita Panda. Magnus Ivan, M.D.DATE OF BIRTH:  1960-09-18   DATE OF ADMISSION:  06/21/2008  DATE OF DISCHARGE:  06/24/2008                               DISCHARGE SUMMARY   ADMITTING DIAGNOSIS:  Left tibial/fibular fracture.   DISCHARGE DIAGNOSIS:  Left tibial/fibular fracture.   PROCEDURES:  Intramedullary nail placement left tibia on June 22, 2008.   HOSPITAL COURSE:  Briefly, Mr. Borquez is a 50 year old gentleman who  was admitted through the emergency room to the orthopedic floor on  June 21, 2008.  He was inebriated on admission he had been playing  soccer earlier that evening when he had a mechanical fall.  He felt a  pop in his leg and was brought to the [facility].  He was found to  have a distal third tibial/fibular fracture.  This was a fracture that  would require operative intervention.  I did placed in a splint on the  evening of his injury and admitted to regular floor bed for pain  control.  I decided not take him emergently to the operating room due to  the fact that he was not developing compartment syndrome.  He was  inebriated and had eaten prior to arrival to the emergency room and had  a full stomach of food. He was taken to the operating room the next day  after being n.p.o. and sobering up.  An intramedullary nail was placed  in left tibia without complications.  He was returned to the orthopedic  floor and began working with physical therapy with weightbearing as  tolerated and a fracture boot/Cam walker.  By the day of discharge he  was tolerating oral pain medications.  His wounds were found to be  clean, dry and intact, and it was felt he could be discharged safely to  home.   DISPOSITION:  To home.   DISCHARGE MEDICATIONS:  1. Percocet p.r.n.  pain.  2. Robaxin p.r.n. spasms.   DISCHARGE INSTRUCTIONS:  While he is at home, he will continue to wear  his boot. He can get his incisions wet in shower.  He will weightbear as  tolerated with follow-up in the office in 2 weeks.      Vanita Panda. Magnus Ivan, M.D.  Electronically Signed     CYB/MEDQ  D:  07/09/2008  T:  07/10/2008  Job:  [id number]

## 2011-01-07 ENCOUNTER — Other Ambulatory Visit: Payer: Self-pay | Admitting: Family Medicine

## 2011-01-07 DIAGNOSIS — E785 Hyperlipidemia, unspecified: Secondary | ICD-10-CM

## 2011-01-09 NOTE — Telephone Encounter (Signed)
Refill request

## 2011-01-26 ENCOUNTER — Other Ambulatory Visit: Payer: Self-pay | Admitting: Family Medicine

## 2011-01-26 DIAGNOSIS — I10 Essential (primary) hypertension: Secondary | ICD-10-CM

## 2011-01-26 NOTE — Telephone Encounter (Signed)
Refill request

## 2011-05-09 ENCOUNTER — Encounter: Payer: Self-pay | Admitting: Family Medicine

## 2011-05-09 ENCOUNTER — Ambulatory Visit (INDEPENDENT_AMBULATORY_CARE_PROVIDER_SITE_OTHER): Payer: Self-pay | Admitting: Family Medicine

## 2011-05-09 VITALS — BP 120/73 | HR 60 | Wt 135.4 lb

## 2011-05-09 DIAGNOSIS — I10 Essential (primary) hypertension: Secondary | ICD-10-CM

## 2011-05-09 DIAGNOSIS — M545 Low back pain, unspecified: Secondary | ICD-10-CM

## 2011-05-09 DIAGNOSIS — E119 Type 2 diabetes mellitus without complications: Secondary | ICD-10-CM

## 2011-05-09 DIAGNOSIS — Z23 Encounter for immunization: Secondary | ICD-10-CM

## 2011-05-09 DIAGNOSIS — I209 Angina pectoris, unspecified: Secondary | ICD-10-CM

## 2011-05-09 DIAGNOSIS — F1021 Alcohol dependence, in remission: Secondary | ICD-10-CM

## 2011-05-09 LAB — POCT URINALYSIS DIPSTICK
Blood, UA: NEGATIVE
Glucose, UA: NEGATIVE
Ketones, UA: NEGATIVE
Spec Grav, UA: 1.015
pH, UA: 7.5

## 2011-05-09 NOTE — Patient Instructions (Signed)
Return in 4 months for check-up  Terbinafine cream twice daily for foot fungus. Aplicar 3-4 semanas.

## 2011-05-09 NOTE — Progress Notes (Signed)
  Subjective:    Patient ID: Jeffrey Gray, male    DOB: Jan 27, 1961, 50 y.o.   MRN: [id number]  HPI is proud to say that he stop drinking alcohol 6 months ago. This happened after both of his sons were taken into detention. They are at risk of being deported really did not do crime.   He has had back pain greater on the left side over the last month no fever chills or other urinary symptoms. Otherwise he feels well with good appetite  He has been taking vitamins from for light and asked for my opinion about these. There were four bottles with many ingredients, from [employer]. I didn't recommend them.   Interview conducted in Spanish   Review of Systems  Respiratory: Negative for cough and shortness of breath.   Cardiovascular: Negative for chest pain.  Genitourinary: Positive for flank pain. Negative for dysuria, urgency and frequency.  Musculoskeletal: Positive for back pain.  Neurological: Negative for numbness.  Psychiatric/Behavioral: Positive for dysphoric mood.       Objective:   Physical Exam  Cardiovascular: Normal rate and regular rhythm.   Pulmonary/Chest: Effort normal and breath sounds normal.  Abdominal: Soft. He exhibits no distension and no mass. There is no tenderness. There is no rebound.  Musculoskeletal:       Pain localized to low back. No CVA tenderness. SLR, FABER and hip rotation normal  Neurological: He is alert. He displays normal reflexes. He exhibits normal muscle tone.  Skin: Skin is warm and dry.       Superficial peeling and interdigital fissuring suggestive of tinea pedis  Psychiatric: He has a normal mood and affect. His behavior is normal.          Assessment & Plan:

## 2011-05-10 ENCOUNTER — Encounter: Payer: Self-pay | Admitting: Family Medicine

## 2011-05-10 LAB — DIFFERENTIAL
Basophils Relative: 1
Lymphocytes Relative: 15
Lymphs Abs: 2.3
Monocytes Relative: 5
Neutro Abs: 12.5 — ABNORMAL HIGH
Neutrophils Relative %: 79 — ABNORMAL HIGH

## 2011-05-10 LAB — CBC
HCT: 40.3
HCT: 42.9
MCHC: 33.8
MCHC: 34.3
MCV: 90.8
MCV: 92.5
Platelets: 171
RDW: 12.4
RDW: 13

## 2011-05-10 LAB — BASIC METABOLIC PANEL
Calcium: 8.1 — ABNORMAL LOW
Creatinine, Ser: 0.76
Glucose, Bld: 160 — ABNORMAL HIGH
Sodium: 138

## 2011-05-10 LAB — ETHANOL: Alcohol, Ethyl (B): 225 — ABNORMAL HIGH

## 2011-05-10 LAB — TYPE AND SCREEN

## 2011-05-10 NOTE — Assessment & Plan Note (Signed)
musculoskeletal

## 2011-05-10 NOTE — Assessment & Plan Note (Signed)
well controlled  

## 2011-05-10 NOTE — Assessment & Plan Note (Signed)
Congratulated him on his heatlthy choice to quit alcohol

## 2011-05-10 NOTE — Assessment & Plan Note (Signed)
No recent chest pain  

## 2011-05-10 NOTE — Assessment & Plan Note (Signed)
>>  ASSESSMENT AND PLAN FOR CORONARY ARTERY SPASM WRITTEN ON 05/10/2011  5:37 AM BY HALE, Claudean Kinds, MD  No recent chest pain

## 2011-06-05 ENCOUNTER — Other Ambulatory Visit: Payer: Self-pay | Admitting: Family Medicine

## 2011-06-05 DIAGNOSIS — E119 Type 2 diabetes mellitus without complications: Secondary | ICD-10-CM

## 2011-06-05 NOTE — Telephone Encounter (Signed)
Refill request

## 2011-10-05 ENCOUNTER — Other Ambulatory Visit: Payer: Self-pay | Admitting: Family Medicine

## 2011-10-05 DIAGNOSIS — I10 Essential (primary) hypertension: Secondary | ICD-10-CM

## 2011-10-05 NOTE — Telephone Encounter (Signed)
Refill request

## 2011-10-16 ENCOUNTER — Ambulatory Visit (INDEPENDENT_AMBULATORY_CARE_PROVIDER_SITE_OTHER): Payer: Self-pay | Admitting: Family Medicine

## 2011-10-16 ENCOUNTER — Encounter: Payer: Self-pay | Admitting: Family Medicine

## 2011-10-16 VITALS — BP 129/88 | HR 70 | Ht 61.0 in | Wt 133.0 lb

## 2011-10-16 DIAGNOSIS — I10 Essential (primary) hypertension: Secondary | ICD-10-CM

## 2011-10-16 DIAGNOSIS — R7401 Elevation of levels of liver transaminase levels: Secondary | ICD-10-CM | POA: Insufficient documentation

## 2011-10-16 DIAGNOSIS — I209 Angina pectoris, unspecified: Secondary | ICD-10-CM

## 2011-10-16 DIAGNOSIS — F1021 Alcohol dependence, in remission: Secondary | ICD-10-CM

## 2011-10-16 DIAGNOSIS — E785 Hyperlipidemia, unspecified: Secondary | ICD-10-CM

## 2011-10-16 DIAGNOSIS — B353 Tinea pedis: Secondary | ICD-10-CM

## 2011-10-16 DIAGNOSIS — E119 Type 2 diabetes mellitus without complications: Secondary | ICD-10-CM

## 2011-10-16 HISTORY — DX: Elevation of levels of liver transaminase levels: R74.01

## 2011-10-16 NOTE — Assessment & Plan Note (Signed)
well controlled  

## 2011-10-16 NOTE — Patient Instructions (Addendum)
Regrese en ayunas para pruebas de sangre para colesterol, etc antes de visitar con Dr Violeta Gelinas  Schedule fasting lab tests in the next couple weeks so Dr Violeta Gelinas has them when you visit him in April  No toma alcohol por riesgo a herir su higado.   Don't drink alcohol for risk of hurting your liver.   Please return to see Dr Sheffield Slider in 6 months.

## 2011-10-16 NOTE — Progress Notes (Signed)
  Subjective:    Patient ID: Jeffrey Gray, male    DOB: [birth year], 51 y.o.   MRN: [id number]  HPIHe has been feeling well without any chest pain. He does hear a noise in his chest if he rotates his trunk.   He admits to drinking 6 or more beers or glasses of wine on weekends  He denies ever having had yellow jaundice or hepatitis.  The fungal rash on his feet resolved after using the cream   Review of Systems     Objective:   Physical Exam  Constitutional: He appears well-developed and well-nourished.  Cardiovascular: Normal rate and regular rhythm.   No murmur heard. Pulmonary/Chest: Breath sounds normal. He is in respiratory distress. He has no wheezes. He has no rales.  Abdominal: Soft. He exhibits no distension and no mass. There is no tenderness. There is no rebound.  Musculoskeletal: He exhibits edema.  Neurological: He is alert.  Psychiatric: He has a normal mood and affect.          Assessment & Plan:

## 2011-10-16 NOTE — Assessment & Plan Note (Signed)
No further chest pain

## 2011-10-16 NOTE — Assessment & Plan Note (Signed)
Will recheck to see if still elevated and screen for Hep C exposure

## 2011-10-16 NOTE — Assessment & Plan Note (Signed)
Due to be checked

## 2011-10-16 NOTE — Assessment & Plan Note (Signed)
>>  ASSESSMENT AND PLAN FOR CORONARY ARTERY SPASM WRITTEN ON 10/16/2011  8:47 PM BY HALE, Claudean Kinds, MD  No further chest pain

## 2011-10-16 NOTE — Assessment & Plan Note (Signed)
He reports that this has resolved. 

## 2011-10-16 NOTE — Assessment & Plan Note (Signed)
Again binge drinking on weekends. Advised that this can hurt his liver

## 2011-10-31 ENCOUNTER — Other Ambulatory Visit: Payer: Self-pay

## 2011-10-31 DIAGNOSIS — E785 Hyperlipidemia, unspecified: Secondary | ICD-10-CM

## 2011-10-31 DIAGNOSIS — I10 Essential (primary) hypertension: Secondary | ICD-10-CM

## 2011-10-31 LAB — COMPREHENSIVE METABOLIC PANEL
Albumin: 4.6 g/dL (ref 3.5–5.2)
Alkaline Phosphatase: 68 U/L (ref 39–117)
BUN: 9 mg/dL (ref 6–23)
Creat: 0.75 mg/dL (ref 0.50–1.35)
Glucose, Bld: 98 mg/dL (ref 70–99)
Potassium: 4 mEq/L (ref 3.5–5.3)
Total Bilirubin: 0.6 mg/dL (ref 0.3–1.2)

## 2011-10-31 LAB — HEPATITIS C ANTIBODY: HCV Ab: NEGATIVE

## 2011-10-31 LAB — CBC
Hemoglobin: 14.8 g/dL (ref 13.0–17.0)
MCH: 30 pg (ref 26.0–34.0)
RBC: 4.93 MIL/uL (ref 4.22–5.81)

## 2011-10-31 NOTE — Progress Notes (Signed)
D-LDL,CMP,CBC AND HEP C Ab DONE TODAY Jeffrey Gray

## 2011-11-24 ENCOUNTER — Telehealth: Payer: Self-pay | Admitting: *Deleted

## 2011-11-24 NOTE — Telephone Encounter (Signed)
Call and scheduled Mr. Wattenbarger for follow-up appointment with Dr. Shirlee Latch on 12/27/11 @ 9:15. Appointment confirmed with patient wife.

## 2011-12-18 ENCOUNTER — Encounter: Payer: Self-pay | Admitting: *Deleted

## 2011-12-26 ENCOUNTER — Ambulatory Visit (INDEPENDENT_AMBULATORY_CARE_PROVIDER_SITE_OTHER): Payer: Self-pay | Admitting: Family Medicine

## 2011-12-26 ENCOUNTER — Encounter: Payer: Self-pay | Admitting: Family Medicine

## 2011-12-26 VITALS — BP 133/84 | HR 77 | Temp 98.8°F | Ht 61.0 in | Wt 135.6 lb

## 2011-12-26 DIAGNOSIS — S91309A Unspecified open wound, unspecified foot, initial encounter: Secondary | ICD-10-CM

## 2011-12-26 DIAGNOSIS — T148XXA Other injury of unspecified body region, initial encounter: Secondary | ICD-10-CM

## 2011-12-26 DIAGNOSIS — S91331A Puncture wound without foreign body, right foot, initial encounter: Secondary | ICD-10-CM | POA: Insufficient documentation

## 2011-12-26 NOTE — Patient Instructions (Addendum)
Please go to [facility] Radiology Department for X-ray of Foot. When you go home, elevate his right foot while resting. Wash foot with soap and water once a day and dry it very well.  Apply topical antibiotic cream twice a day. If you develop any worsening pain, swelling, redness or pus around puncture wound, please return to clinic. Please refer to handout from up to date for further home care instructions.

## 2011-12-26 NOTE — Progress Notes (Signed)
  Subjective:    Patient ID: Jeffrey Gray, male    DOB: 26-Mar-1961, 51 y.o.   MRN: [id number]  HPI  Puncture wound, right foot: Jeffrey Gray presents to clinic after stepping on a rusty nail yesterday. History is provided by daughter who also translates for patient.  Patient was wearing socks and shoes when he stepped on a nail.  After injury, daughter washed his foot with water and hydrogen peroxide. Patient complained of pain yesterday, but says pain is better today. Most painful when patient's bears weight on right foot.  He does not like taking pain medication.  Per her records, last tetanus shot was May 24, 2009. He is a diabetic, well controlled on metformin. Last A1c was 6.1 in March 2013.  He denies any fever, chills, night sweats. He denies any foot pain at rest, the pain is worse with exertion and very weight. Patient denies any swelling, redness or pus or blood oozing around puncture wound.   Review of Systems  Per history of present illness    Objective:   Physical Exam  Constitutional: No distress.  Musculoskeletal: Normal range of motion. He exhibits no edema.  Neurological: He is alert.  Skin:       Right plantar aspect of foot: Small, linear, 1 mm puncture wound. Dried blood present. No surrounding induration, erythema or swelling appreciated. Tender on palpation of plantar surface of foot surrounding wound. Strong pedal pulse. Good capillary refill.         Assessment & Plan:

## 2011-12-26 NOTE — Assessment & Plan Note (Signed)
Puncture wound in a diabetic patient puts him at high risk for infection. Wound was probed per Dr. Leveda Anna in clinic. Will order foot x-ray to rule out foreign body or evidence of osteomyelitis. Advised patient to rest and elevate foot for the next couple days. Advised patient to wash foot thoroughly and to apply antibiotic ointment to wound daily. Handout given for home instructions, wound care. Red flags reviewed.  Signs of infection discussed with patient and daughter.  See after visit summary.

## 2011-12-27 ENCOUNTER — Ambulatory Visit (INDEPENDENT_AMBULATORY_CARE_PROVIDER_SITE_OTHER): Payer: Self-pay | Admitting: Cardiology

## 2011-12-27 ENCOUNTER — Encounter: Payer: Self-pay | Admitting: Family Medicine

## 2011-12-27 ENCOUNTER — Ambulatory Visit (HOSPITAL_COMMUNITY)
Admission: RE | Admit: 2011-12-27 | Discharge: 2011-12-27 | Disposition: A | Discharge status: Home / Self Care | Payer: Self-pay | Source: Ambulatory Visit | Attending: Family Medicine | Admitting: Family Medicine

## 2011-12-27 ENCOUNTER — Encounter: Payer: Self-pay | Admitting: Cardiology

## 2011-12-27 VITALS — BP 130/82 | HR 63 | Ht 61.0 in | Wt 134.0 lb

## 2011-12-27 DIAGNOSIS — X58XXXA Exposure to other specified factors, initial encounter: Secondary | ICD-10-CM | POA: Insufficient documentation

## 2011-12-27 DIAGNOSIS — E785 Hyperlipidemia, unspecified: Secondary | ICD-10-CM

## 2011-12-27 DIAGNOSIS — R079 Chest pain, unspecified: Secondary | ICD-10-CM | POA: Insufficient documentation

## 2011-12-27 DIAGNOSIS — I10 Essential (primary) hypertension: Secondary | ICD-10-CM

## 2011-12-27 DIAGNOSIS — I2510 Atherosclerotic heart disease of native coronary artery without angina pectoris: Secondary | ICD-10-CM

## 2011-12-27 DIAGNOSIS — T148XXA Other injury of unspecified body region, initial encounter: Secondary | ICD-10-CM

## 2011-12-27 DIAGNOSIS — S91309A Unspecified open wound, unspecified foot, initial encounter: Secondary | ICD-10-CM | POA: Insufficient documentation

## 2011-12-27 NOTE — Patient Instructions (Signed)
Your physician recommends that you continue on your current medications as directed. Please refer to the Current Medication list given to you today.  Your physician has requested that you have en exercise stress myoview. For further information please visit [email or web address] Please follow instruction sheet, as given.  Your physician wants you to follow-up in: 1 year with Dr Shirlee Latch. You will receive a reminder letter in the mail two months in advance. If you don't receive a letter, please call our office to schedule the follow-up appointment.

## 2011-12-27 NOTE — Progress Notes (Signed)
PCP: Dr. 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  He has a history of prior chest pain with no significant disease on cath in 9/10.  He was started on amlodipine for potential coronary vasospasm but has subsequently stopped it.  When I saw him last, he was not having any chest pain episodes.  He is here today with his daughter who interprets.  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  Since starting this new job, he has been noting central chest tightness with carrying a load up steps or doing heavy work. The pain will last about 10 minutes then resolve.  This is different from his prior chest pain which has occurred at rest.   Labs (11/10): LDL 93, HDL 48 Labs (2/12): LDL 101, K 4, creatinine 0.88  ECG: NSR, early repolarization.   Allergies (verified):  No Known Drug Allergies  Past Medical History: 1. HTN 2. alcohol abuse vs dependence 3. Chest pain: ETT (9/10) equivocal (8.5 METS, nonspecific changes but chest pain with exertion).  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 EF 65%. There was, however, noted to be significant vasospasm in the left main and LAD relieved by intracoronary nitroglycerine.  Treated in the past with amlodipine for vasospasm. 4. DM2 5. Hyperlipidemia  Family History: father--MI at 49s, htn, DM, "colon problem" mother--htn, some type of liver problem brother--MI at 12  Social History: Lives with wife and 3 children, also has a grown son.  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   Drank heavily in the past but has cut back.  Moved here from [place]   ROS: All systems reviewed and negative except as per HPI.   Current Outpatient Prescriptions  Medication Sig Dispense Refill  . aspirin (ASPIRIN LOW DOSE) 81 MG EC tablet Take 81 mg by mouth daily.        Marland Kitchen lisinopril-hydrochlorothiazide (PRINZIDE,ZESTORETIC) 20-12.5 MG per tablet TAKE TWO TABLETS BY MOUTH EVERY DAY  60 tablet  2  .  metFORMIN (GLUCOPHAGE) 1000 MG tablet TAKE ONE-HALF TABLET BY MOUTH EVERY DAY FOR DIABETES  30 tablet  11  . metoprolol tartrate (LOPRESSOR) 25 MG tablet TAKE ONE TABLET BY MOUTH TWICE DAILY  60 tablet  11  . nitroGLYCERIN (NITROSTAT) 0.4 MG SL tablet Place 0.4 mg under the tongue every 5 (five) minutes as needed.       . pravastatin (PRAVACHOL) 40 MG tablet TAKE ONE TABLET BY MOUTH EVERY DAY AT BEDTIME.TOME UNA TABLETA VIA ORAL EN LA NOCHE  30 tablet  6    BP 130/82  Pulse 63  Ht 5\' 1"  (1.549 m)  Wt 134 lb (60.782 kg)  BMI 25.32 kg/m2 General: NAD Neck: No JVD, no thyromegaly or thyroid nodule.  Lungs: Clear to auscultation bilaterally with normal respiratory effort. CV: Nondisplaced PMI.  Heart regular S1/S2, no S3/S4, no murmur.  No peripheral edema.  No carotid bruit.  Normal pedal pulses.  Abdomen: Soft, nontender, no hepatosplenomegaly, no distention.  Neurologic: Alert and oriented x 3.  Psych: Normal affect. Extremities: No clubbing or cyanosis.

## 2011-12-27 NOTE — Assessment & Plan Note (Signed)
LDL is reasonable if no CAD.  However, if CAD is noted on cath, goal for LDL will be < 70.

## 2011-12-27 NOTE — Assessment & Plan Note (Signed)
Jeffrey Gray has a prior history of chest pain with no significant coronary disease.  However, past chest pain was not exertional.  His current chest pain is related to exertion and does not feel like his prior pain.  RFs include HTN and hyperlipidemia.  - Continue ASA 81, statin, metoprolol.  - ETT-myoview for risk stratification.

## 2012-01-11 ENCOUNTER — Ambulatory Visit (HOSPITAL_COMMUNITY): Payer: Self-pay | Attending: Cardiology | Admitting: Radiology

## 2012-01-11 VITALS — BP 159/85 | Ht 62.0 in | Wt 133.0 lb

## 2012-01-11 DIAGNOSIS — I2510 Atherosclerotic heart disease of native coronary artery without angina pectoris: Secondary | ICD-10-CM | POA: Insufficient documentation

## 2012-01-11 DIAGNOSIS — R079 Chest pain, unspecified: Secondary | ICD-10-CM

## 2012-01-11 DIAGNOSIS — I10 Essential (primary) hypertension: Secondary | ICD-10-CM | POA: Insufficient documentation

## 2012-01-11 MED ORDER — TECHNETIUM TC 99M TETROFOSMIN IV KIT
11.0000 | PACK | Freq: Once | INTRAVENOUS | Status: AC | PRN
  Administered 2012-01-11: 11 via INTRAVENOUS

## 2012-01-11 MED ORDER — TECHNETIUM TC 99M TETROFOSMIN IV KIT
33.0000 | PACK | Freq: Once | INTRAVENOUS | Status: AC | PRN
  Administered 2012-01-11: 33 via INTRAVENOUS

## 2012-01-11 NOTE — Progress Notes (Addendum)
[facility] SITE 3 NUCLEAR MED [address] [place] [address] [phone]  Cardiology Nuclear Med Study  Jeffrey Gray is a 51 y.o. male     MRN : [id number]     DOB: 02/01/1961  Procedure Date: 01/11/2012  Nuclear Med Background Indication for Stress Test:  Evaluation for Ischemia History:  2010 GXT-equivocal with CP, 2010 Heart Catheterization- mild non-obstructive disease, EF 65% Cardiac Risk Factors: Family History - CAD, Hypertension, Lipids and NIDDM  Symptoms:  Chest Pain with Exertion (last date of chest discomfort this week)   Nuclear Pre-Procedure Caffeine/Decaff Intake:  None NPO After: 8:00pm   Lungs:  clear O2 Sat: 98% on room air. IV 0.9% NS with Angio Cath:  20g  IV Site: R Antecubital  IV Started by:  Stanton Kidney, EMT-P  Chest Size (in):  40 Cup Size: n/a  Height: 5\' 2"  (1.575 m)  Weight:  133 lb (60.328 kg)  BMI:  Body mass index is 24.33 kg/(m^2). Tech Comments:  Metoprolol held > 24 hours, per patient (via Nurse, learning disability)    Nuclear Med Study 1 or 2 day study: 1 day  Stress Test Type:  Stress  Reading MD: Willa Rough, MD  Order Authorizing Provider:  Marca Ancona, MD  Resting Radionuclide: Technetium 14m Tetrofosmin  Resting Radionuclide Dose: 11.0 mCi   Stress Radionuclide:  Technetium 31m Tetrofosmin  Stress Radionuclide Dose: 33.0 mCi           Stress Protocol Rest HR: 74 Stress HR: 166  Rest BP: 159/85 Stress BP: 189/92  Exercise Time (min): 7:18 METS: 8.4   Predicted Max HR: 170 bpm % Max HR: 97.65 bpm Rate Pressure Product: 21308   Dose of Adenosine (mg):  n/a Dose of Lexiscan: n/a mg  Dose of Atropine (mg): n/a Dose of Dobutamine: n/a mcg/kg/min (at max HR)  Stress Test Technologist: Bonnita Levan, RN  Nuclear Technologist:  Domenic Polite, CNMT     Rest Procedure:  Myocardial perfusion imaging was performed at rest 45 minutes following the intravenous administration of Technetium 49m Tetrofosmin. Rest ECG:  NSR  Stress Procedure:  The patient performed treadmill exercise using a Bruce  Protocol for 7:18 minutes. The patient stopped due to fatigue and c/o CP 5/10. The chest pain continued into recovery and subsided after 1 NTG tablet given SL. There were ST-T wave changes with exercise.  Technetium 58m Tetrofosmin was injected at peak exercise and myocardial perfusion imaging was performed after a brief delay. Stress ECG: No significant change from baseline ECG  QPS Raw Data Images:  Patient motion noted; appropriate software correction applied. Stress Images:  Normal homogeneous uptake in all areas of the myocardium. Rest Images:  Normal homogeneous uptake in all areas of the myocardium. Subtraction (SDS):  No evidence of ischemia. Transient Ischemic Dilatation (Normal <1.22):  0.96 Lung/Heart Ratio (Normal <0.45):  0.33  Quantitative Gated Spect Images QGS EDV:  64 ml QGS ESV:  16 ml  Impression Exercise Capacity:  Fair exercise capacity. BP Response:  Normal blood pressure response. Clinical Symptoms:  chest tightness ECG Impression:  No significant ST segment change suggestive of ischemia. Comparison with Prior Nuclear Study: No images to compare  Overall Impression:  Normal stress nuclear study. The patient did have some chest tightness with stress. He received one sublingual nitroglycerin in recovery and the discomfort resolved. There were no diagnostic EKG changes. The nuclear images are normal. There is no scar or ischemia. There is normal wall motion.  LV Ejection Fraction:  76%.  LV Wall Motion:  Normal Wall Motion  Willa Rough, MD  Normal perfusion images.  He did have chest pain with exertion, but had this prior to normal cath in 2010 as well. Think he is unlikely to have obstructive CAD.  May have vasospasm or microvascular disease.   Dalton [employer]

## 2012-01-15 NOTE — Progress Notes (Signed)
Spoke with pt's daughter, [name]. Pt does not speak [place].

## 2012-01-19 ENCOUNTER — Other Ambulatory Visit: Payer: Self-pay | Admitting: Family Medicine

## 2012-03-15 ENCOUNTER — Ambulatory Visit (INDEPENDENT_AMBULATORY_CARE_PROVIDER_SITE_OTHER): Payer: Self-pay | Admitting: Family Medicine

## 2012-03-15 ENCOUNTER — Encounter: Payer: Self-pay | Admitting: Family Medicine

## 2012-03-15 VITALS — BP 117/76 | HR 72 | Temp 97.6°F | Ht 61.0 in | Wt 132.4 lb

## 2012-03-15 DIAGNOSIS — F419 Anxiety disorder, unspecified: Secondary | ICD-10-CM

## 2012-03-15 DIAGNOSIS — R079 Chest pain, unspecified: Secondary | ICD-10-CM

## 2012-03-15 DIAGNOSIS — F411 Generalized anxiety disorder: Secondary | ICD-10-CM

## 2012-03-15 MED ORDER — ISOSORBIDE MONONITRATE ER 30 MG PO TB24: 30.0000 mg | ORAL_TABLET | Freq: Every day | ORAL | Status: DC

## 2012-03-15 MED ORDER — CITALOPRAM HYDROBROMIDE 20 MG PO TABS: 20.0000 mg | ORAL_TABLET | Freq: Every day | ORAL | Status: DC

## 2012-03-15 NOTE — Patient Instructions (Addendum)
It was good to see you today! I want you to start taking a medication to help with feeling overwhelmed.  This medication is called celexa. I would also like you to start taking a medication called Imdur.  This is a medication for chest pain.  You will be taking it one time per day.  i would like you to try to get in to see Dr. Malachy Chamber 8/26.  Fue bueno verte hoy! Quiero que empiece a tomar un medicamento para ayudar a sentirse menos preocupado. Este medicamento se llama Celexa. Tambin me gustara que usted comience a tomar un medicamento llamado Imdur. Este es un medicamento para el dolor de [place]. Usted va a tomar Medical sales representative. me gustara que usted trate de  a ver a Dr. Serita Sheller 08/26.

## 2012-03-15 NOTE — Progress Notes (Signed)
Patient ID: Jeffrey Gray, male   DOB: 11/30/1960, 51 y.o.   MRN: [id number] Subjective: The patient is a 51 y.o. year old male who presents today for problems with   Cardiology has seen him, patient reports that they did a stress test and saw no evidence of blockage.  Patient also has feeling of being over stressed some of the time.  This is happening 2-3 times per week.  Feels like he is out of control of his situation.  Does have some chest discomfort when feels this.  However, pain is primarily when he is actually doing activity.  Then chest pain comes on with activity and is relieved with rest.  Is substernal to left sided pressure with no radiation.  No SOB.  No headaches, no visual changes.  No n/v/d.  No weakness.  Patient's past medical, social, and family history were reviewed and updated as appropriate. History  Substance Use Topics  . Smoking status: Never Smoker   . Smokeless tobacco: Never Used  . Alcohol Use: 3.6 oz/week    6 Cans of beer per week     former heavy drinker, has cut back   Objective:  Filed Vitals:   03/15/12 1001  BP: 117/76  Pulse: 72  Temp: 97.6 F (36.4 C)   Gen: NAD CV: RRR Resp: CTABL Ext: No edema, 2+ pulses, right first finger missing at PIP joint  Assessment/Plan:  Please also see individual problems in problem list for problem-specific plans.

## 2012-03-16 DIAGNOSIS — F419 Anxiety disorder, unspecified: Secondary | ICD-10-CM | POA: Insufficient documentation

## 2012-03-16 HISTORY — DX: Anxiety disorder, unspecified: F41.9

## 2012-03-16 NOTE — Assessment & Plan Note (Signed)
Patient appears to be describing anxiety over job stresses.  It is somewhat unclear exactly how bad his anxiety is outside of concerns about job performance but it is bothersome enough for him to seek medical attention.  I will begin treatment with low dose celexa and have patient return in 2 weeks ot see how he is doing.

## 2012-03-16 NOTE — Assessment & Plan Note (Signed)
Cardiology has seen the patient and he has had a nuclear stress test demonstrating no obvious ischemia.  Despite this, the description the patient gives is very convincing for stable angina.  Symptoms are not currently controlled.  Will begin treatment with long acting nitrate (symptoms have been relieved with SL nitro in the past) and have him come back in 2 weeks to see how this is working for him.

## 2012-03-18 ENCOUNTER — Ambulatory Visit: Payer: Self-pay | Admitting: Family Medicine

## 2012-04-01 ENCOUNTER — Encounter: Payer: Self-pay | Admitting: Family Medicine

## 2012-04-01 ENCOUNTER — Ambulatory Visit (INDEPENDENT_AMBULATORY_CARE_PROVIDER_SITE_OTHER): Payer: Self-pay | Admitting: Family Medicine

## 2012-04-01 VITALS — BP 140/78 | HR 76 | Ht 61.0 in | Wt 133.0 lb

## 2012-04-01 DIAGNOSIS — F419 Anxiety disorder, unspecified: Secondary | ICD-10-CM

## 2012-04-01 DIAGNOSIS — R079 Chest pain, unspecified: Secondary | ICD-10-CM

## 2012-04-01 DIAGNOSIS — F411 Generalized anxiety disorder: Secondary | ICD-10-CM

## 2012-04-01 DIAGNOSIS — E119 Type 2 diabetes mellitus without complications: Secondary | ICD-10-CM

## 2012-04-01 DIAGNOSIS — E785 Hyperlipidemia, unspecified: Secondary | ICD-10-CM

## 2012-04-01 MED ORDER — SIMVASTATIN 40 MG PO TABS: 40.0000 mg | ORAL_TABLET | Freq: Every day | ORAL | Status: DC

## 2012-04-01 NOTE — Assessment & Plan Note (Signed)
LDL is higher than goal at last check.  Will increase to simvastatin 40 and recheck in ~6 months.

## 2012-04-01 NOTE — Patient Instructions (Addendum)
It was great to see you today! We are going to change your cholesterol medication from pravastatin to simvastatin.   I have sent in this prescription. I want you to come back in about 2 weeks for a blood pressure check with our nurses so we can decide if your blood pressure medicines need to be changes. Plan on coming back to see [place] in about 3 months.  Fue genial verte hoy! Vamos a Mining engineer de pravastatina a la simvastatina. He enviado en esta receta. Quiero que vuelvas en aproximadamente 2 semanas para un control de la presin arterial con nuestro personal de enfermera para que podamos decidir si sus medicamentos para la presin arterial deben ser los cambios. Pensando en volver a vernos en unos 3 meses.

## 2012-04-01 NOTE — Assessment & Plan Note (Addendum)
Reports some improvement in anxiety/worrying but hasn't been in a lot of situations that would normally cause problems.  Will continue current dose, RTC in 3 months to monitor for effect

## 2012-04-01 NOTE — Assessment & Plan Note (Signed)
A1c appropriate today.  Cont current management.

## 2012-04-01 NOTE — Assessment & Plan Note (Signed)
Improved.  Will continue Imdur and f/u in 3 months to monitor for effect

## 2012-04-01 NOTE — Progress Notes (Signed)
Interpreter Wyvonnia Dusky for [facility]

## 2012-04-01 NOTE — Progress Notes (Signed)
Patient ID: Jeffrey Gray, male   DOB: 07/20/1961, 51 y.o.   MRN: [id number] Subjective: The patient is a 51 y.o. year old male who presents today for f/u.  1. CP: Now taking Imdur.  Not having any pain but not in a lot of situations where he would have been in pain prior.  No CP/DOE/LE edema, headaches, visual changes  2. Worrying: Patient now taking Celexa.  Reports less worry but has not been in a lot of situations where he would have been worrying.  3. DM: Does not check blood sugar.  Does not have meter.  No obvious episodes of symptomatic hypoglycemia. A1c today 5.9.  4. HLD: No myalgias.  Taking pravastatin 40.  Patient's past medical, social, and family history were reviewed and updated as appropriate. History  Substance Use Topics  . Smoking status: Never Smoker   . Smokeless tobacco: Never Used  . Alcohol Use: 3.6 oz/week    6 Cans of beer per week     former heavy drinker, has cut back   Objective:  Filed Vitals:   04/01/12 1357  BP: 159/90  Pulse: 76   Gen: NAD CV: RRR Resp: CTABL Ext: No edema, 2+ pulses, right first finger missing at PIP joint  Assessment/Plan:  Please also see individual problems in problem list for problem-specific plans.

## 2012-04-15 ENCOUNTER — Other Ambulatory Visit: Payer: Self-pay | Admitting: *Deleted

## 2012-04-15 MED ORDER — LISINOPRIL-HYDROCHLOROTHIAZIDE 20-12.5 MG PO TABS: 2.0000 | ORAL_TABLET | Freq: Every day | ORAL | Status: DC

## 2012-04-16 ENCOUNTER — Telehealth: Payer: Self-pay | Admitting: Family Medicine

## 2012-04-16 MED ORDER — PRAVASTATIN SODIUM 40 MG PO TABS: 40.0000 mg | ORAL_TABLET | Freq: Every day | ORAL | Status: DC

## 2012-04-16 NOTE — Telephone Encounter (Signed)
Will forward  message to Dr. Hale. 

## 2012-04-16 NOTE — Telephone Encounter (Signed)
Please let him know that I replaced it with Pravastatin that he can get on the $10 plan for 3 months worth

## 2012-04-16 NOTE — Telephone Encounter (Signed)
Pt states that he has not been able to afford the simvastatin - wants to know if there is something cheaper. [facility] [place]

## 2012-04-16 NOTE — Telephone Encounter (Signed)
Notified. 

## 2012-04-17 NOTE — Telephone Encounter (Signed)
I call a Pt I spoke with daughter about medication plan.  [place]

## 2012-05-10 ENCOUNTER — Other Ambulatory Visit: Payer: Self-pay | Admitting: Family Medicine

## 2012-08-02 ENCOUNTER — Ambulatory Visit (INDEPENDENT_AMBULATORY_CARE_PROVIDER_SITE_OTHER): Payer: Self-pay | Admitting: Family Medicine

## 2012-08-02 ENCOUNTER — Encounter: Payer: Self-pay | Admitting: Family Medicine

## 2012-08-02 VITALS — BP 155/79 | HR 87 | Temp 98.3°F | Ht 61.0 in | Wt 140.5 lb

## 2012-08-02 DIAGNOSIS — M6740 Ganglion, unspecified site: Secondary | ICD-10-CM

## 2012-08-02 HISTORY — DX: Ganglion, unspecified site: M67.40

## 2012-08-02 NOTE — Assessment & Plan Note (Signed)
He was reassured knot over dorsum of left foot was consistent with a ganglion cyst. He would not look it drained or surgically removed at this time since it is not hurting him or causing any other problems.

## 2012-08-02 NOTE — Progress Notes (Signed)
  Subjective:    Patient ID: Jeffrey Gray, male    DOB: 25-Mar-1961, 51 y.o.   MRN: [id number]  HPI # Knot on left foot He was concerned because it came up a few weeks ago, and he wanted to make sure it was not worrisome with his history of diabetes. He denies pain, fever, skin redness/warmth.  Review of Systems  Allergies, medication, past medical history reviewed.  -Well controlled diabetes mellitus on metformin     Objective:   Physical Exam Gen: NAD; well-appearing, -nourished PSYCH: pleasant LEFT FOOT:    Overall foot is clean, dry, no lesions including calluses; toenails well groomed   1 cm nodule over dorsum of foot, fluctuant; no skin erythema; non-tender    Assessment & Plan:

## 2012-08-02 NOTE — Progress Notes (Signed)
Interpreter Wyvonnia Dusky for Dr Pleas Patricia

## 2012-08-02 NOTE — Patient Instructions (Addendum)
Tiene una quiste de ganglio en el pie.  Regrese a la clinica si hace grande y tiene Engineer, mining o tiene cambios en el piel.   Jeffrey Gray cita con doctor Sheffield Slider para hablar sobre su diabetes.

## 2012-08-12 ENCOUNTER — Ambulatory Visit (INDEPENDENT_AMBULATORY_CARE_PROVIDER_SITE_OTHER): Payer: Self-pay | Admitting: Family Medicine

## 2012-08-12 ENCOUNTER — Encounter: Payer: Self-pay | Admitting: Family Medicine

## 2012-08-12 VITALS — BP 152/90 | HR 57 | Temp 98.4°F | Ht 61.0 in | Wt 142.0 lb

## 2012-08-12 DIAGNOSIS — Z23 Encounter for immunization: Secondary | ICD-10-CM

## 2012-08-12 DIAGNOSIS — E119 Type 2 diabetes mellitus without complications: Secondary | ICD-10-CM

## 2012-08-12 DIAGNOSIS — I10 Essential (primary) hypertension: Secondary | ICD-10-CM

## 2012-08-12 DIAGNOSIS — I2510 Atherosclerotic heart disease of native coronary artery without angina pectoris: Secondary | ICD-10-CM

## 2012-08-12 MED ORDER — NITROGLYCERIN 0.4 MG SL SUBL: 0.4000 mg | SUBLINGUAL_TABLET | SUBLINGUAL | Status: DC | PRN

## 2012-08-12 MED ORDER — ISOSORBIDE MONONITRATE ER 30 MG PO TB24: 30.0000 mg | ORAL_TABLET | Freq: Every day | ORAL | Status: DC

## 2012-08-12 NOTE — Assessment & Plan Note (Signed)
His chest pains are atypical, but will see effects of restarting Isosorbide.

## 2012-08-12 NOTE — Progress Notes (Signed)
  Subjective:    Patient ID: Jeffrey Gray, male    DOB: 1961/03/24, 52 y.o.   MRN: [id number]  HPI CAD - gets some short, sharp chest pain with exertion, but no prolonged chest pressure or radiation. Hasn't taken NTG or isosorbide recently   DIABETES MELLITUS - No hypoglycemia symptoms   Ganglion left foot. Hasn't gotten larger and only bothers him if he walks a long time. Doesn't have diabetic shoes.   Depression - hasn't had these symptoms and stopped his Citalopram months ago.    Review of Systems     Objective:   Physical Exam  Constitutional: He is oriented to person, place, and time.  Cardiovascular: Normal rate and regular rhythm.   Pulmonary/Chest: Effort normal and breath sounds normal.  Neurological: He is alert and oriented to person, place, and time.  Psychiatric: He has a normal mood and affect. His behavior is normal. Thought content normal.          Assessment & Plan:

## 2012-08-12 NOTE — Patient Instructions (Addendum)
Empiece otra vez Isosorbide para prevenir dolor del pecho y Personal assistant presion.   Regrese en 1 mes para chequeo de presion arterial  Return in 1 month for blood pressure check

## 2012-08-12 NOTE — Assessment & Plan Note (Signed)
Inadequately controlled, but low pulse rate. Restart Isosorbide to see effects on blood pressure.

## 2012-08-12 NOTE — Assessment & Plan Note (Signed)
well controlled  

## 2012-09-11 ENCOUNTER — Other Ambulatory Visit: Payer: Self-pay | Admitting: Family Medicine

## 2013-01-01 ENCOUNTER — Other Ambulatory Visit: Payer: Self-pay | Admitting: Family Medicine

## 2013-01-01 NOTE — Telephone Encounter (Signed)
He needs an office visit before further refills.

## 2013-01-21 ENCOUNTER — Ambulatory Visit (INDEPENDENT_AMBULATORY_CARE_PROVIDER_SITE_OTHER): Payer: Self-pay | Admitting: Family Medicine

## 2013-01-21 ENCOUNTER — Encounter: Payer: Self-pay | Admitting: Family Medicine

## 2013-01-21 VITALS — BP 144/84 | HR 82 | Ht 62.0 in | Wt 136.0 lb

## 2013-01-21 DIAGNOSIS — E119 Type 2 diabetes mellitus without complications: Secondary | ICD-10-CM

## 2013-01-21 DIAGNOSIS — E785 Hyperlipidemia, unspecified: Secondary | ICD-10-CM

## 2013-01-21 DIAGNOSIS — Z1211 Encounter for screening for malignant neoplasm of colon: Secondary | ICD-10-CM

## 2013-01-21 DIAGNOSIS — I209 Angina pectoris, unspecified: Secondary | ICD-10-CM

## 2013-01-21 DIAGNOSIS — I10 Essential (primary) hypertension: Secondary | ICD-10-CM

## 2013-01-21 LAB — POCT GLYCOSYLATED HEMOGLOBIN (HGB A1C): Hemoglobin A1C: 6.2

## 2013-01-21 NOTE — Patient Instructions (Signed)
Tomar la mitad de una tableta de Pravastatin antes de dormir.   Regrese en 3 meses para otra chequeo de diabetes y presion.

## 2013-01-21 NOTE — Assessment & Plan Note (Addendum)
Well controlled with only CAD as complication

## 2013-01-21 NOTE — Assessment & Plan Note (Signed)
Controlled per home measurements.

## 2013-01-21 NOTE — Assessment & Plan Note (Signed)
Recurrence of this or GERD

## 2013-01-21 NOTE — Assessment & Plan Note (Signed)
>>  ASSESSMENT AND PLAN FOR CORONARY ARTERY SPASM WRITTEN ON 01/21/2013  6:15 PM BY Zachery Dauer, MD  Recurrence of this or GERD

## 2013-01-21 NOTE — Assessment & Plan Note (Signed)
He is to try a half of a pravastatin tablet to see if the lightheadedness recurs

## 2013-01-21 NOTE — Progress Notes (Signed)
  Subjective:    Patient ID: Jeffrey Gray, male    DOB: 1960/11/14, 52 y.o.   MRN: [id number]  Diabetes  Hypertension  He was out fishing 2 days ago and notice a sore area on his inner thigh. Doesn't recall an injury.  chest pain - took ntg x one for this. non-exertional. Came after he drank strong coffee, so he thinks it was from reflux.   Hyperlipidemia - stopped the Pravachol because it seemed to make him lightheaded.   Hypertension - says his blood pressure systolics are in the 120-130 range at [facility].   Review of Systems     Objective:   Physical Exam  Constitutional: He appears well-developed and well-nourished.  Cardiovascular: Normal rate and regular rhythm.   No murmur heard. Pulmonary/Chest: Effort normal and breath sounds normal. He has no wheezes. He has no rales.  Skin: No rash noted.  4x5 cm bruised area right medial thigh with a subcutaneous nodule in the middle. No other bruising seen          Assessment & Plan:

## 2013-01-24 LAB — POC HEMOCCULT BLD/STL (HOME/3-CARD/SCREEN): Card #2 Fecal Occult Blod, POC: NEGATIVE

## 2013-01-24 NOTE — Addendum Note (Signed)
Addended by: [place], Jais Demir on: 01/24/2013 03:06 PM   Modules accepted: Orders

## 2013-02-06 ENCOUNTER — Encounter: Payer: Self-pay | Admitting: Family Medicine

## 2013-03-19 ENCOUNTER — Other Ambulatory Visit: Payer: Self-pay | Admitting: *Deleted

## 2013-03-20 MED ORDER — LISINOPRIL-HYDROCHLOROTHIAZIDE 20-12.5 MG PO TABS: 2.0000 | ORAL_TABLET | Freq: Every day | ORAL | Status: DC

## 2013-07-07 ENCOUNTER — Ambulatory Visit (INDEPENDENT_AMBULATORY_CARE_PROVIDER_SITE_OTHER): Payer: Self-pay | Admitting: Family Medicine

## 2013-07-07 ENCOUNTER — Encounter: Payer: Self-pay | Admitting: Family Medicine

## 2013-07-07 VITALS — BP 135/83 | HR 54 | Temp 98.2°F | Ht 61.0 in | Wt 132.0 lb

## 2013-07-07 DIAGNOSIS — K219 Gastro-esophageal reflux disease without esophagitis: Secondary | ICD-10-CM

## 2013-07-07 DIAGNOSIS — Z23 Encounter for immunization: Secondary | ICD-10-CM

## 2013-07-07 DIAGNOSIS — E785 Hyperlipidemia, unspecified: Secondary | ICD-10-CM

## 2013-07-07 DIAGNOSIS — I10 Essential (primary) hypertension: Secondary | ICD-10-CM

## 2013-07-07 DIAGNOSIS — I2510 Atherosclerotic heart disease of native coronary artery without angina pectoris: Secondary | ICD-10-CM

## 2013-07-07 DIAGNOSIS — E118 Type 2 diabetes mellitus with unspecified complications: Secondary | ICD-10-CM

## 2013-07-07 NOTE — Patient Instructions (Addendum)
Diabetes - continue Metformin 500 mg daily  High Blood pressure - controlled today, continue current blood pressure medications  High Cholesterol - check lipid profile  Congratulations on quitting drinking. Please let us know if you relapse or need help quitting in the future.  Acid Reflux - takes TUMs as needed   Return to office in 3 months. Please make an appointment to have cholesterol labs drawn.

## 2013-07-07 NOTE — Progress Notes (Addendum)
   Subjective:    Patient ID: Jeffrey Gray, male    DOB: 01-05-1961, 52 y.o.   MRN: [id number]  HPI 52 year old male presents for followup of multiple medical problems. Interview performed with the help of an interpreter.  Non-insulin-dependent type 2 diabetes-patient is currently on metformin 500 mg daily, fasting glucoses 80-90's, denies numbness/tinglins in extremities, had retinal scan in June 2014, follows with an optometrist, no foot ulcerations, does not follow with a podiatrist, denies extremity numbness or tingling  Hypertension-patient is currently on combination lisinopril-hydrochlorothiazide 20-12.5 mg daily and metoprolol 25 mg twice a day, denies side effects, no chest pain, no vision changes, no lightheadeness  Hyperlipidemia - had previously been on Pravachol, had lightheadedness, was told to half the tablet however he completley stopped  Social-nonsmoker, has not drank in one month, previously drank 3-4 24 oz beers per day, does not report withdrawal symptoms  He reports occasional ithchiness when taking his medications, also reports occasional burning type sensation in his chest, not related to activity, denies burping, no nausea, no associated epigastric pain, no radiation of the pain to jaw or left arm, no associated shortness of breath, he is able to exercise and not have this sensation  Review of Systems  Constitutional: Negative for fever, chills and fatigue.  Respiratory: Negative for cough and shortness of breath.   Cardiovascular: Negative for chest pain, palpitations and leg swelling.  Gastrointestinal: Negative for nausea, vomiting and diarrhea.  Endocrine: Negative for polydipsia, polyphagia and polyuria.       Objective:   Physical Exam Vitals: Reviewed General: Hispanic male, no acute distress HEENT: Normocephalic, pupils are equal round and reactive to light, extraocular movements were intact, moist mucous membranes, no pharyngeal erythema or exudates,  uvula midline, neck was supple, no cervical adenopathy next a cardiac: Regular rate and rhythm, S1 and S2 present, no murmurs, no heaves or thrills Respiratory: Clear to auscultation bilaterally, normal effort Abdomen: Soft, nontender, bowel sounds present Extremities: No edema, 2+ radial and dorsalis pedis pulse Skin: Normal foot examination, no rash  Retinal examination in June of 2014 was negative for diabetic retinopathy  Stress test in June of 2013 was negative for ischemic changes     Assessment & Plan:  Please see problem specific assessment and plan. Influenza shot provided today.

## 2013-07-08 ENCOUNTER — Encounter: Payer: Self-pay | Admitting: Family Medicine

## 2013-07-08 ENCOUNTER — Other Ambulatory Visit: Payer: Self-pay

## 2013-07-08 DIAGNOSIS — E785 Hyperlipidemia, unspecified: Secondary | ICD-10-CM

## 2013-07-08 DIAGNOSIS — K219 Gastro-esophageal reflux disease without esophagitis: Secondary | ICD-10-CM

## 2013-07-08 HISTORY — DX: Gastro-esophageal reflux disease without esophagitis: K21.9

## 2013-07-08 LAB — LIPID PANEL
Cholesterol: 147 mg/dL (ref 0–200)
Total CHOL/HDL Ratio: 3.5 Ratio
Triglycerides: 127 mg/dL (ref <150)
VLDL: 25 mg/dL (ref 0–40)

## 2013-07-08 NOTE — Assessment & Plan Note (Signed)
Patient is having intermittent burning type sensation in his chest that is most consistent with acid reflux. Stress test performed in June of 2013 was unremarkable. Chest pain is atypical for CAD. -Will treat symptomatically with TUMS

## 2013-07-08 NOTE — Assessment & Plan Note (Signed)
Well-controlled at this time based on fasting blood glucoses. Hemoglobin A1c pending at time of discharge. Up-to-date on eye examinations. No evidence of neuropathy. No evidence of nephropathy -Continue metformin 500 mg daily -Continue yearly eye examinations -Return to office in 3-6 months for followup of diabetes

## 2013-07-08 NOTE — Assessment & Plan Note (Signed)
Well-controlled on current regimen. ?

## 2013-07-08 NOTE — Progress Notes (Signed)
FLP DONE TODAY Jeffrey Gray 

## 2013-07-08 NOTE — Assessment & Plan Note (Signed)
No recent chest pain. He has not required nitroglycerin. Will titrate ASCVD risk score based on cholesterol panel.

## 2013-07-08 NOTE — Assessment & Plan Note (Signed)
Patient not currently on pharmacotherapy due to side effects of Pravachol. Given history of diabetes and hypertension patient would likely benefit from cholesterol medication for primary prevention of ASCVD. Will check fasting lipid profile. Will calculate ASCVD risk score based on these results. Patient aware that he may require Lipitor for primary prevention.

## 2013-07-09 ENCOUNTER — Telehealth: Payer: Self-pay | Admitting: Family Medicine

## 2013-07-09 MED ORDER — PRAVASTATIN SODIUM 40 MG PO TABS: 40.0000 mg | ORAL_TABLET | Freq: Every day | ORAL | Status: DC

## 2013-07-09 NOTE — Telephone Encounter (Signed)
Spoke to patient through interpretor [employer]) - informed patient of lipid panel and A1C results, ASCVD risk score of 7.3, recommended that patient continue current Metformin dose and resume Pravastatin 40 mg daily.

## 2013-08-23 ENCOUNTER — Other Ambulatory Visit: Payer: Self-pay | Admitting: Family Medicine

## 2013-09-01 ENCOUNTER — Other Ambulatory Visit: Payer: Self-pay | Admitting: Family Medicine

## 2013-09-01 ENCOUNTER — Telehealth: Payer: Self-pay | Admitting: Family Medicine

## 2013-09-01 MED ORDER — LISINOPRIL-HYDROCHLOROTHIAZIDE 20-12.5 MG PO TABS: 2.0000 | ORAL_TABLET | Freq: Every day | ORAL | Status: DC

## 2013-09-01 NOTE — Telephone Encounter (Signed)
Will fwd to MD.  Charrie Mcconnon L, CMA  

## 2013-09-01 NOTE — Telephone Encounter (Signed)
Pt called and needs a refill on his BP medication called in to the pharmacy. jw

## 2013-09-05 ENCOUNTER — Ambulatory Visit (INDEPENDENT_AMBULATORY_CARE_PROVIDER_SITE_OTHER): Payer: Self-pay | Admitting: Family Medicine

## 2013-09-05 VITALS — BP 130/80

## 2013-09-05 DIAGNOSIS — M928 Other specified juvenile osteochondrosis: Secondary | ICD-10-CM

## 2013-09-05 DIAGNOSIS — M92521 Juvenile osteochondrosis of tibia tubercle, right leg: Secondary | ICD-10-CM

## 2013-09-05 DIAGNOSIS — E118 Type 2 diabetes mellitus with unspecified complications: Secondary | ICD-10-CM

## 2013-09-05 DIAGNOSIS — M9251 Juvenile osteochondrosis of tibia and fibula, right leg: Secondary | ICD-10-CM

## 2013-09-05 HISTORY — DX: Juvenile osteochondrosis of tibia tubercle, right leg: M92.521

## 2013-09-05 NOTE — Patient Instructions (Addendum)
Diabetes - your diabetes is well controlled, please stop Metformin, return for recheck of diabetes in May  Blood Pressure - your blood pressure is well controlled  Knee pain - see handout  Osgood-Schlatter Disease Osgood-Schlatter disease is a condition that is common in adolescents. It is most often seen during the time of growth spurts. During these times the muscles and cord-like structures that attach muscle to bone (tendons) are becoming tighter as the bones are becoming longer. This puts more strain on areas of tendon attachment. The condition is soreness (inflammation) of the lump on the upper leg below the kneecap (tibial tubercle). There is pain and tenderness in this area because of the inflammation. In addition to growth spurts, it also comes on with physical activities involving running and jumping. This is a self-limited condition. It can get well by itself in time with conservative measures and less physical activities. It can persist up to two years. DIAGNOSIS  The diagnosis is made by physical examination alone. X-rays are sometimes needed to rule out other problems. HOME CARE INSTRUCTIONS   Apply ice packs to the areas of pain 03-04 times a day for 15-20 minutes while awake. Do this for 2 days.  Limit physical activities to levels that do not cause pain.  Do stretching exercises for the legs and especially the large muscles in the front of the thigh (quadriceps). Avoid quadriceps strengthening exercises.  Only take over-the-counter or prescription medicines for pain, discomfort, or fever as directed by your caregiver.  Usually steroid injection or surgery is not necessary. Surgery is rarely needed if the condition persists into young adulthood.  See your caregiver if you develop increased pain or swelling in the area, if you have pain with movement of the knee, develop a temperature, or have more pain or problems that originally brought you in for care. Recheck with the  hospital or clinic if x-rays were taken. After a radiologist (a specialist in reading x-rays) has read your x-rays, make sure there is agreement with the initial readings. Find out if more studies are needed. Ask your caregiver how you are to learn about your radiology (x-ray) results. Remember it is your responsibility to obtain the results of your x-rays. MAKE SURE YOU:   Understand these instructions.  Will watch your condition.  Will get help right away if you are not doing well or get worse. Document Released: 07/21/2000 Document Revised: 10/16/2011 Document Reviewed: 07/20/2008 [facility] Patient Information 2014 [place], [place].

## 2013-09-05 NOTE — Progress Notes (Signed)
   Subjective:    Patient ID: Jeffrey MooreJulian Gray, male    DOB: 10/31/1960, 53 y.o.   MRN: [id number]  HPI 53 year old Hispanic male follows up for multiple medical problems. Daughter is serving as Equities traderinterpreter.  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, he eats 2 main meals per day including dinner around 2 AM and breakfast around 11 AM, heaves multiple snacks including fruits and crackers throughout the day, has become more active with his new job as a Copyjanitor  Right anterior knee pain-has been present for the past months to years, he has noticed no change in the pain since starting his new job, pain is located primarily over the anterior aspect of the right tibia and he has noted increasing size of a "bump "over the area, he has not attempted any conservative measures including ice or anti-inflammatories   Review of Systems  Constitutional: Negative for fever, chills and fatigue.  Respiratory: Negative for cough, choking and shortness of breath.   Cardiovascular: Negative for chest pain.       Objective:   Physical Exam Vitals: Reviewed General: Pleasant Hispanic male, no acute distress Cardiac: Regular rate and rhythm, S1 and S2 present, no murmurs, heaves or thrills Respiratory: Clear to auscultation bilaterally, normal effort Musculoskeletal: Hyper perforation of bone consistent with apophysitis over the distal aspect of the patellar insertion on the tibia (tibial tuberosity), range of motion including extension and flexion of the knee was full, no joint line tenderness, muscle strength testing was 5 of 5 with flexion and extension, no patellar tendon tenderness was appreciated   Last A1c in December 2014 was 5.9    Assessment & Plan:  Please see problem  specific assessment and plan the

## 2013-09-05 NOTE — Assessment & Plan Note (Signed)
Patient reports asymptomatic hypoglycemia based on blood glucose readings -As previous hemoglobin A1c was well controlled will discontinue metformin at this time -Patient to return for recheck of hemoglobin A1c in May of 2015 which will be 6 months after previous A1c check

## 2013-09-05 NOTE — Assessment & Plan Note (Signed)
Patient presents with signs and symptoms consistent with Osgood-Schlatter's of the right knee -conservative management discussed including rest/ice/compression/stretching

## 2013-12-26 ENCOUNTER — Ambulatory Visit (INDEPENDENT_AMBULATORY_CARE_PROVIDER_SITE_OTHER): Payer: Self-pay | Admitting: Family Medicine

## 2013-12-26 ENCOUNTER — Encounter: Payer: Self-pay | Admitting: Family Medicine

## 2013-12-26 VITALS — BP 135/84 | HR 90 | Temp 98.1°F | Wt 135.0 lb

## 2013-12-26 DIAGNOSIS — M62838 Other muscle spasm: Secondary | ICD-10-CM

## 2013-12-26 DIAGNOSIS — I2510 Atherosclerotic heart disease of native coronary artery without angina pectoris: Secondary | ICD-10-CM

## 2013-12-26 DIAGNOSIS — M7700 Medial epicondylitis, unspecified elbow: Secondary | ICD-10-CM

## 2013-12-26 DIAGNOSIS — E118 Type 2 diabetes mellitus with unspecified complications: Secondary | ICD-10-CM

## 2013-12-26 DIAGNOSIS — E785 Hyperlipidemia, unspecified: Secondary | ICD-10-CM

## 2013-12-26 DIAGNOSIS — I10 Essential (primary) hypertension: Secondary | ICD-10-CM

## 2013-12-26 DIAGNOSIS — M7702 Medial epicondylitis, left elbow: Secondary | ICD-10-CM

## 2013-12-26 LAB — CBC
HCT: 41.4 % (ref 39.0–52.0)
Hemoglobin: 14.4 g/dL (ref 13.0–17.0)
MCH: 30.3 pg (ref 26.0–34.0)
MCHC: 34.8 g/dL (ref 30.0–36.0)
MCV: 87.2 fL (ref 78.0–100.0)
PLATELETS: 282 10*3/uL (ref 150–400)
RBC: 4.75 MIL/uL (ref 4.22–5.81)
RDW: 13.3 % (ref 11.5–15.5)
WBC: 8.2 10*3/uL (ref 4.0–10.5)

## 2013-12-26 LAB — BASIC METABOLIC PANEL
BUN: 11 mg/dL (ref 6–23)
CHLORIDE: 97 meq/L (ref 96–112)
CO2: 28 meq/L (ref 19–32)
Calcium: 9.1 mg/dL (ref 8.4–10.5)
Creat: 0.67 mg/dL (ref 0.50–1.35)
Glucose, Bld: 97 mg/dL (ref 70–99)
POTASSIUM: 3.7 meq/L (ref 3.5–5.3)
Sodium: 133 mEq/L — ABNORMAL LOW (ref 135–145)

## 2013-12-26 LAB — POCT GLYCOSYLATED HEMOGLOBIN (HGB A1C): HEMOGLOBIN A1C: 6

## 2013-12-26 MED ORDER — ATORVASTATIN CALCIUM 40 MG PO TABS: 40.0000 mg | ORAL_TABLET | Freq: Every day | ORAL | Status: DC

## 2013-12-26 NOTE — Patient Instructions (Signed)
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 El dolor tiene su origen en la inflamacin de los tendones del antebrazo que flexionan la [place]. Esta afeccin tambin es conocida como "codo de [place] Minor Outlying Islandsgolfista" debido a que es comn The Krogerentre los que Audiological scientistpractican golf. Sin embargo, Nurse, children'spuede suceder en cualquier individuo que flexione la mueca de [place]era repetitiva. Si esta afeccin no se trata, puede transformarse en un problema crnico. SNTOMAS  Dolor y sensibilidad e inflamacin en la zona interna (medial) del codo.  Dolor o debilidad al tomar TEPPCO Partnersobjetos.  Dolor que aumenta con los movimientos de giro de la [place]ue[place] (al usar un Engineer, maintenancedestornillador, jugar al golf o al bowling). CAUSAS La causa de la epicondilitis medial es la inflamacin de los tendones que flexionan la [place]ueca. Entre las causas se incluyen:  Tensin crnica repetitiva y distensin de los tendones que Reynolds Americanadhieren los msculos del antebrazo y la [place] al codo.  Distensin sbita del Product managerantebrazo, como el que sufre la mueca al realizar saques en los deportes con raqueta o en el lanzamiento, en el bisbol. LOS RIESGOS AUMENTAN CON:  Los deportes u ocupaciones que requieren movimientos repetitivos y extenuantes del Product managerantebrazo y Warden/rangerla mueca (como en el lanzamiento en el bisbol, el golf o la carpintera)  Poca fuerza y flexibilidad de la mueca y Glass blower/designerel antebrazo.  Precalentamiento y elongacin inadecuados antes de la  Irwinactividad.  Regreso a la actividad antes de Lehman Brotherscompletar la curacin, la rehabilitacin y Public relations account executiveel entrenamiento. PREVENCIN  Precalentamiento adecuado y elongacin antes de la Lemoyneactividad.  Mantener la forma fsica:  Earma ReadingFuerza, flexibilidad y resistencia muscular.  Capacidad cardiovascular.  Utilice un equipo que le ajuste adecuadamente.  Usar la tcnica correcta y Warehouse managertener un entrenador que corrija la tcnica incorrecta.  Use un vendaje para el codo apropiado para el tenis (contrafuerza). PRONSTICO El curso de la enfermedad depende del grado de la lesin. Si se trata adecuadamente, los casos agudos (sntomas que duran menos de 4 semanas) generalmente se resuelven en un perodo de 2 a 6 semanas. Los casos crnicos (que duran ms tiempo) se resuelven en un lapso de 3 a 6 meses, pero pueden requerir de tratamiento fisioteraputico. COMPLICACIONES RELACIONADAS  La recurrencia frecuente de los sntomas puede dar como resultado un problema crnico. Un tratamiento adecuado en su inicio disminuye la probabilidad de recurrencias.  La inflamacin crnica, degeneracin cicatrizal del tendn y ruptura parcial del tendn, requieren [place]anciruga.  Demora de la curacin o de la resolucin de los sntomas. TRATAMIENTO El tratamiento inicial consiste en la toma de medicamentos y la aplicacin de hielo para Engineer, materialsaliviar el dolor y reducir la hinchazn. Los ejercicios de elongacin y fortalecimiento pueden ayudar a reducir las molestias si se realizan con regularidad. Podr realizar estos ejercicios en su casa, si se trata de una afeccin aguda. Los casos crnicos pueden requerir la derivacin a un fisioterapeuta para Magazine features editorrealizar una evaluacin y Dentistcomenzar un tratamiento. Su mdico podr indicarle inyecciones con corticoides para reducir la inflamacin. Raras veces es necesario someterse a [place] Herz[place]ovinauna ciruga. MEDICAMENTOS  Si es necesaria la administracin de medicamentos para Chief Technology Officer, se recomiendan los antiinflamatorios no esteroides,  (aspirina e ibuprofeno) u otros calmantes menores (acetaminofeno).  No tome medicamentos para el dolor dentro de los [address] previos a la [place].  El profesional podr prescribirle calmantes si lo considera necesario. Utilcelos como se le indique y slo cuando lo necesite.  Se podrn recomendar inyecciones de corticoesteorides. Estas inyecciones deben reservarse para los [place] graves, porque slo se pueden administrar una determinada cantidad de veces. CALOR Y FRO  El fro debe aplicarse durante 10 a 15 minutos cada 2  3 horas para reducir la inflamacin y Chief Technology Officer e inmediatamente despus de cualquier actividad que agrava los sntomas. Utilice bolsas o un masaje de hielo.  El calor puede usarse antes de Therapist, music y de las actividades de fortalecimiento indicadas por el profesional, le fisioterapeuta o Orthoptist. Utilice una bolsa trmica o un pao hmedo. SOLICITE ATENCIN MDICA SI: Los sntomas empeoran o no mejoran en 2 semanas, a pesar de Medical illustrator. EJERCICIOS  EJERCICIOS DE AMPLITUD DE MOVIMIENTOS Y ELONGACIN  Epicondilitis medial (codo de [place]) Estos ejercicios le ayudarn en la recuperacin de la lesin. Los sntomas podrn desaparecer con o sin mayor intervencin del profesional, el fisioterapeuta o Orthoptist. Al completar estos ejercicios, recuerde:   Restaurar la flexibilidad del tejido ayuda a que las articulaciones recuperen el movimiento normal. Esto permite que el movimiento y la actividad sea ms saludables y menos dolorosos.  Para que sea efectiva, cada elongacin debe realizarse durante al menos 30 segundos.  La elongacin nunca debe ser dolorosa. Deber sentir slo un alargamiento suave o elongacin del tejido que estira. AMPLITUD DE MOVIMIENTOS  Flexin de la [place], asistida  Extienda el codo derecha / izquierdo con los dedos apuntando [place] abajo.*  Tire suavemente la palma de la mano hacia usted, hasta que sienta un ligero estiramiento de  la parte superior del [place].  Mantenga esta posicin durante __________ segundos. Reptalo __________ veces. Realice este estiramiento __________ Anthoney Harada por da.  *Realice este ejercicio con el codo flexionado en lugar de extendido si el mdico, fisioterapeuta o entrenador se lo indican. AMPLITUD DE MOVIMIENTOS  Flexin de la [place], asistida  Extienda el codo derecha / izquierdo con la palma [place] arriba.*  Tire suavemente la palma y la punta de los dedos Powers atrs, para que la [place] se extienda y los dedos apunten hacia el suelo.  Debe sentir un ligero estiramiento en la parte interior del antebrazo.  Mantenga esta posicin durante __________ segundos. Reptalo __________ veces. Realice este estiramiento __________ Anthoney Harada por da. *Realice este ejercicio con el codo flexionado en lugar de extendido si el mdico, fisioterapeuta o entrenador se lo indican. FUERZA  Extensin de la [employer] puntas de los dedos de la mano derecha / izquierdo plana sobre una mesa, con el codo ligeramente doblado. Los dedos deben apuntar [employer].  Presione suavemente los dedos y la mano en la mesa y enderece el codo. Debe sentir un ligero estiramiento en la parte interna del antebrazo.  Mantenga esta posicin durante __________ segundos. Reptalo __________ veces. Realice este estiramiento __________ Anthoney Harada por da.  EJERCICIOS DE FORTALECIMIENTO  Epicondilitis medial (codo de [place]) Estos ejercicios le ayudarn en la recuperacin de la lesin. Los sntomas podrn desaparecer con o sin mayor intervencin del profesional, el fisioterapeuta o Orthoptist. Al completar estos ejercicios, recuerde:   Los msculos pueden ganar tanto la resistencia como la fuerza que necesita para sus  actividades diarias a travs de ejercicios controlados.  Realice los ejercicios como se lo indic el mdico, el fisioterapeuta o Orthoptist. Aumente la resistencia y repeticiones segn se le haya  indicado.  Podr experimentar dolor o cansancio muscular, pero el dolor o molestia que trata de eliminar a travs de los ejercicios nunca debe empeorar. Si el dolor empeora, detngase y asegrese de que est siguiendo las directivas correctamente. Si an siente dolor luego de Education officer, environmental lo ajustes necesarios, deber discontinuar el ejercicio hasta que pueda conversar con el profesional sobre el problema. FUERZA Flexores de la mueca  Sintese con el antebrazo derecha / izquierdo con la palma hacia arriba y completamente apoyado sobre una mesa o [place]. El codo Scientist, research (physical sciences) en reposo y a la altura de los hombros. Haga que la [place] se extienda sobre los extremos de la superficie.  Sostenga sin apretar un peso de __________, Cheron Schaumann goma o tubo para ejercicios en ambas manos, y doble lentamente la mano hacia el [place].  Mantenga esta posicin durante __________ segundos. Baje lentamente la [employer] posicin inicial, de forma controlada. Reptalo __________ veces. Realice este estiramiento __________ Anthoney Harada por da.  FUERZA  Extensores de la mueca  Sintese con el antebrazo derecha / izquierdo con la palma hacia abajo y completamente apoyado sobre una mesa. El codo Scientist, research (physical sciences) en reposo y a la altura de los hombros. Haga que la [place] se extienda sobre los extremos de la superficie.  Sostenga sin apretar un peso de __________, Cheron Schaumann goma o tubo para ejercicios en ambas manos, y doble lentamente la mano hacia el [place].  Mantenga esta posicin durante __________ segundos. Baje lentamente la [employer] posicin inicial, de forma controlada. Reptalo __________ veces. Realice este estiramiento __________ Anthoney Harada por da.  FUERZA  Desviacin ulnar  Prese sosteniendo un peso de ____________________ en su mano derecha / izquierdo, o sintese sosteniendo una banda de goma para ejercicios, con el brazo sano apoyado en una mesa o mesada.  Mueva la [place], para que el dedo meique apunte hacia el  antebrazo y Multimedia programmer en contra del mismo.  Mantenga esta posicin durante __________ segundos y luego lentamente baje la mueca a la posicin inicial. Reptalo __________ veces. Realice este ejercicio __________ veces por da. FUERZA  Agarre  [employer] pelota de tenis, una esponja dura o una media larga y [place].  Apritela lo ms fuerte que pueda, sin Tax inspector.  Mantenga esta posicin durante __________ segundos. Sultela lentamente. Reptalo __________ veces. Realice este estiramiento __________ Anthoney Harada por da.  FUERZA  Supinadores del antebrazo  Sintese con su antebrazo derecha / izquierdo apoyado sobre una mesa, manteniendo el codo por debajo de la altura del hombro. Apoye la [employer] borde, con la palma [place].  Suavemente tome un martillo o un cucharn de sopa.  Sin mover el codo, gire lentamente la palma y la mano hacia arriba para colocar el "pulgar arriba".  Mantenga esta posicin durante __________ segundos. Vuelva lentamente a la posicin inicial. Reptalo __________ veces. Realice este estiramiento __________ Anthoney Harada por da.  FUERZA  Pronadores del antebrazo  Sintese con su antebrazo derecha / izquierdo apoyado sobre una mesa, manteniendo el codo por debajo de la altura del hombro. Apoye la [employer] borde, con la palma [place].  Suavemente tome un martillo o un cucharn de sopa.  Sin mover el codo, gire lentamente la palma y la mano hacia arriba para colocar el "pulgar arriba".  Mantenga esta  posicin durante __________ segundos. Vuelva lentamente a la posicin inicial. Reptalo __________ veces. Realice este estiramiento __________ Anthoney Harada por da.  Document Released: 05/10/2006 Document Revised: 10/16/2011 [facility] Patient Information 2014 [place], [place].

## 2013-12-27 DIAGNOSIS — M7702 Medial epicondylitis, left elbow: Secondary | ICD-10-CM | POA: Insufficient documentation

## 2013-12-27 DIAGNOSIS — M62838 Other muscle spasm: Secondary | ICD-10-CM | POA: Insufficient documentation

## 2013-12-27 NOTE — Assessment & Plan Note (Signed)
Patient presents for evaluation of left medial elbow pain that is consistent with medial epicondylitis. -discussed ice/heat as needed, avoidance of activity that triggers pain, tylenol or NSAIDS as needed for pain, may wear brace at work

## 2013-12-27 NOTE — Assessment & Plan Note (Signed)
Muscle spasm of left trapezius from overuse injury at work. -apply heat/ice daily -may take Tylenol/NSAIDS as needed for pain -avoid activities that trigger pain.

## 2013-12-27 NOTE — Assessment & Plan Note (Signed)
No recent chest pain. Has not needed nitroglycerin.

## 2013-12-27 NOTE — Assessment & Plan Note (Signed)
At goal. Patient counseled to take Metoprolol twice daily as prescribed.

## 2013-12-27 NOTE — Assessment & Plan Note (Addendum)
Well controlled of medications. A1C 6.0 today. Normal foot exam today. Due for eye exam in 01/2014. -Encouraged lifestyle modifications -Recheck A1C in 6 months

## 2013-12-27 NOTE — Assessment & Plan Note (Signed)
Will change Pravastatin to Lipitor given history of non-obstructive CAD.

## 2013-12-27 NOTE — Progress Notes (Signed)
   Subjective:    Patient ID: Jeffrey Gray, male    DOB: Mar 13, 1961, 53 y.o.   MRN: [id number]  HPI 53 y/o male presents for routine follow up. Daughter [name] is present and serves as Engineer, technical sales.  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, does not adhere to a specific diet, due for eye exam in June 2015, no numbness in hands/feet  Hypertension - currently on Lisinopril-HCTZ 20-12.5 daily, also on Metoprolol 25 mg (only taking once daily, prescribed BID), no chest pain, no lightheadedness, no vision changes  HLD - currently on Pravastatin does have a history of CAD, has intermittent right sided abdominal pain with pravastatin, no muscle cramps  Left arm pain - medial elbow and left trazezius muscle, worse over the past few day, he thinks this is related to work as a Copy, 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  Respiratory: Negative for cough, choking and shortness of breath.   Gastrointestinal: Negative for nausea, vomiting and diarrhea.  Musculoskeletal: Positive for arthralgias and myalgias.       Objective:   Physical Exam  Constitutional: He appears well-developed and well-nourished.  HENT:  Head: Normocephalic and atraumatic.  Mouth/Throat: Uvula is midline, oropharynx is clear and moist and mucous membranes are normal.  Eyes: Conjunctivae and EOM are normal. Pupils are equal, round, and reactive to light.  Neck: Normal range of motion. Neck supple. No JVD present. No thyromegaly present.  Cardiovascular: Normal rate, regular rhythm and normal  heart sounds.   Pulmonary/Chest: Effort normal and breath sounds normal.  Abdominal: Soft. Bowel sounds are normal. There is no tenderness.  Ext: no edema, normal foot examination MSK; tenderness over the left medial epicondyle (worse with resisted wrist flexion), normal ROM of left shoulder/elbow/wrist, tenderness over the left trapezius muscles, rotator cuff intact (strengh 5/5 empty can/IR/ER/lift off test)      Assessment & Plan:  Please see problem specific assessment and plan.

## 2013-12-30 ENCOUNTER — Encounter: Payer: Self-pay | Admitting: Family Medicine

## 2014-02-02 ENCOUNTER — Other Ambulatory Visit: Payer: Self-pay | Admitting: Family Medicine

## 2014-09-28 ENCOUNTER — Encounter: Payer: Self-pay | Admitting: Family Medicine

## 2014-11-02 ENCOUNTER — Ambulatory Visit (INDEPENDENT_AMBULATORY_CARE_PROVIDER_SITE_OTHER): Payer: Self-pay | Admitting: Family Medicine

## 2014-11-02 ENCOUNTER — Encounter: Payer: Self-pay | Admitting: Family Medicine

## 2014-11-02 VITALS — BP 144/86 | HR 72 | Temp 99.2°F | Ht 61.0 in | Wt 138.9 lb

## 2014-11-02 DIAGNOSIS — E119 Type 2 diabetes mellitus without complications: Secondary | ICD-10-CM

## 2014-11-02 DIAGNOSIS — Z0000 Encounter for general adult medical examination without abnormal findings: Secondary | ICD-10-CM | POA: Insufficient documentation

## 2014-11-02 DIAGNOSIS — R1011 Right upper quadrant pain: Secondary | ICD-10-CM

## 2014-11-02 DIAGNOSIS — E785 Hyperlipidemia, unspecified: Secondary | ICD-10-CM

## 2014-11-02 DIAGNOSIS — Z23 Encounter for immunization: Secondary | ICD-10-CM

## 2014-11-02 DIAGNOSIS — E118 Type 2 diabetes mellitus with unspecified complications: Secondary | ICD-10-CM

## 2014-11-02 LAB — COMPREHENSIVE METABOLIC PANEL
ALBUMIN: 4.3 g/dL (ref 3.5–5.2)
ALT: 59 U/L — ABNORMAL HIGH (ref 0–53)
AST: 36 U/L (ref 0–37)
Alkaline Phosphatase: 74 U/L (ref 39–117)
BUN: 8 mg/dL (ref 6–23)
CO2: 27 meq/L (ref 19–32)
CREATININE: 0.76 mg/dL (ref 0.50–1.35)
Calcium: 9.2 mg/dL (ref 8.4–10.5)
Chloride: 94 mEq/L — ABNORMAL LOW (ref 96–112)
Glucose, Bld: 100 mg/dL — ABNORMAL HIGH (ref 70–99)
Potassium: 3.6 mEq/L (ref 3.5–5.3)
Sodium: 131 mEq/L — ABNORMAL LOW (ref 135–145)
Total Bilirubin: 0.5 mg/dL (ref 0.2–1.2)
Total Protein: 8.1 g/dL (ref 6.0–8.3)

## 2014-11-02 LAB — LIPID PANEL
Cholesterol: 159 mg/dL (ref 0–200)
HDL: 60 mg/dL (ref >=40)
LDL CALC: 89 mg/dL (ref 0–99)
Total CHOL/HDL Ratio: 2.7 Ratio
Triglycerides: 51 mg/dL (ref <150)
VLDL: 10 mg/dL (ref 0–40)

## 2014-11-02 LAB — POCT GLYCOSYLATED HEMOGLOBIN (HGB A1C): Hemoglobin A1C: 5.7

## 2014-11-02 MED ORDER — SIMVASTATIN 40 MG PO TABS: 40.0000 mg | ORAL_TABLET | Freq: Every day | ORAL | Status: DC

## 2014-11-02 NOTE — Patient Instructions (Signed)
It was nice to see you today.  Continue your current medications.  Dr. Randolm IdolFletke will call you with your lab results.  My nurse will schedule and ultrasound for you.   A new cholesterol medication (Simvastatin) has been sent to your pharmacy.

## 2014-11-02 NOTE — Assessment & Plan Note (Signed)
Patient was not able to afford Lipitor -started Simvastatin 40 mg daily

## 2014-11-02 NOTE — Progress Notes (Signed)
   Subjective:    Patient ID: Jeffrey MooreJulian Ho, male    DOB: Dec 26, 1960, 54 y.o.   MRN: [id number]  HPI 54 y/o male presents for annual preventative exam. Spanish interpretor present  Diabetes - not currently on medication, last eye exam 01/2013, denies foot issues  Right upper quadrant pain - reports occasional right upper quadrant pain, colicky in nature, no relation to food, no previous abdominal surgeries, no associated nausea/emesis  Diet - 0-1 servings of fruits and vegetables daily, 1-2 servings of dairy daily, occasional fast food, eats a lot of beans/rice, less than 3 tortillas per day  Exercise - active at work, no formal exercise  Cancer Screening  Colon - fecal occult cards negative 2014  Prostate - no FH of prostate issues, denies decreased flow or nocturia  Immunizations  Tdap 2010  Flu 2014  PNA 2010  HLD - Patient reports not getting Lipitor after last visit as too expensive, asking for cheaper medication  Social - lives with wife, never a smoker, sexually active with wife   Review of Systems  Constitutional: Negative for fever, chills and fatigue.  Respiratory: Negative for cough and shortness of breath.   Cardiovascular: Negative for chest pain and leg swelling.  Gastrointestinal: Positive for abdominal pain. Negative for nausea, vomiting, diarrhea and abdominal distention.       Objective:   Physical Exam Vitals: reviewed Gen: pleasant male, NAD HEENT: normocephalic, PERRL, EOMI, no scleral icterus, MMM, neck supple, no thyromegaly, no anterior or posterior cervical lymphadenopathy Cardiac: RRR, S1 and S2 present, no murmurs, no heaves/thrills Resp: CTAB, normal effort Abd: soft, no tenderness, no scars, normal bowel sounds Ext: 2+ radial pulses, no edema      Assessment & Plan:  Please see problem specific assessment and plan.

## 2014-11-02 NOTE — Assessment & Plan Note (Signed)
Well controlled off medications. A1C 5.7 -continue lifestyle modifications -due for yearly eye exam

## 2014-11-02 NOTE — Assessment & Plan Note (Signed)
Intermittent right upper quadrant abdominal pain for 6 months. Exam unremarkable. History of alcoholism. -check CMP -check RUQ [place]

## 2014-11-02 NOTE — Assessment & Plan Note (Signed)
Patient presents for yearly preventative visit -discussed diet and exercise changes -check HIV per CDC recommendations -up to date on vaccines other than flu which was given today -check FOB X3 per patient request (consider colonoscopy when he has orange card) -No urinary symptoms therefore no DRE/PSA -return in one year

## 2014-11-03 LAB — HIV ANTIBODY (ROUTINE TESTING W REFLEX): HIV 1&2 Ab, 4th Generation: NONREACTIVE

## 2014-11-04 ENCOUNTER — Encounter: Payer: Self-pay | Admitting: Family Medicine

## 2014-11-04 ENCOUNTER — Other Ambulatory Visit: Payer: Self-pay | Admitting: Family Medicine

## 2014-11-06 ENCOUNTER — Telehealth: Payer: Self-pay | Admitting: Family Medicine

## 2014-11-06 ENCOUNTER — Ambulatory Visit (HOSPITAL_COMMUNITY)
Admission: RE | Admit: 2014-11-06 | Discharge: 2014-11-06 | Disposition: A | Discharge status: Home / Self Care | Payer: Self-pay | Source: Ambulatory Visit | Attending: Family Medicine | Admitting: Family Medicine

## 2014-11-06 DIAGNOSIS — K760 Fatty (change of) liver, not elsewhere classified: Secondary | ICD-10-CM | POA: Insufficient documentation

## 2014-11-06 DIAGNOSIS — F1020 Alcohol dependence, uncomplicated: Secondary | ICD-10-CM

## 2014-11-06 DIAGNOSIS — R1011 Right upper quadrant pain: Secondary | ICD-10-CM | POA: Insufficient documentation

## 2014-11-06 HISTORY — DX: Fatty (change of) liver, not elsewhere classified: K76.0

## 2014-11-06 NOTE — Telephone Encounter (Signed)
Discussed ultrasound results and recent lab results with patient using Spanish Interpretor. US showed evidence of fatty liver/hepatocellular disease (I spoke with radiology and no concern for mass/malignancy at this time). Patient reports drinking 4-5 12 oz beers per day and sometimes up to 10/day on the weekends. Denies drinking liquor however has done so in the past. LFT's slightly elevated.   Notified patient that I will place order to check for hepatitis. Also recommended that he cut down on alcohol consumption. Will address further at future visits.

## 2014-11-06 NOTE — Assessment & Plan Note (Signed)
Patient admits to drinking 4-5 12 oz beers per day and up to 10/day on weekend. Has fatty liver disease/early cirrhosis on US. -counseled to decrease alcohol consumption.

## 2014-11-06 NOTE — Assessment & Plan Note (Signed)
Fatty liver and hepatocellular disease of liver on US likely due to alcohol use. -will check acute hepatitis panel -patient counseled to decreased alcohol intake

## 2014-11-23 ENCOUNTER — Telehealth: Payer: Self-pay | Admitting: Family Medicine

## 2014-11-23 NOTE — Telephone Encounter (Signed)
Pt needs results from last visit and needs clarification on when to get blood work done / thanks HoneywellSadie Reynolds, ASA

## 2014-11-25 NOTE — Telephone Encounter (Signed)
Attempted to contact patient using spanish interpretor. No answer. Left message that I will attempt to call again tomorrow. Of note I sent a letter to his house about his lab results (it is unclear if he received this). I have also placed additional labs to be drawn (he needs a lab appointment to have these drawn when he is able to).

## 2014-11-26 NOTE — Telephone Encounter (Signed)
Daughter and patient returned call. Daughter served as Engineer, technical salesinterpretor. Discuss recent last results. Told of elevated liver enzymes and need to stop drinking alcohol. Patient informed that other lab work has been ordered and needs to make follow up lab appointment.

## 2014-11-26 NOTE — Telephone Encounter (Signed)
Attempted to contact patient using spanish interpretor. No answer on cell phone. Reached daughter on home phone however patient not home. Left message to return call to office.

## 2014-11-30 ENCOUNTER — Ambulatory Visit: Payer: Self-pay

## 2014-12-07 ENCOUNTER — Ambulatory Visit: Payer: Self-pay

## 2014-12-25 ENCOUNTER — Other Ambulatory Visit: Payer: Self-pay | Admitting: *Deleted

## 2014-12-28 MED ORDER — LISINOPRIL-HYDROCHLOROTHIAZIDE 20-12.5 MG PO TABS: 2.0000 | ORAL_TABLET | Freq: Every day | ORAL | Status: DC

## 2015-02-09 ENCOUNTER — Encounter: Payer: Self-pay | Admitting: Family Medicine

## 2015-02-09 ENCOUNTER — Ambulatory Visit (INDEPENDENT_AMBULATORY_CARE_PROVIDER_SITE_OTHER): Payer: Self-pay | Admitting: Family Medicine

## 2015-02-09 VITALS — BP 148/81 | HR 90 | Temp 97.6°F | Ht 61.0 in | Wt 135.5 lb

## 2015-02-09 DIAGNOSIS — M791 Myalgia, unspecified site: Secondary | ICD-10-CM | POA: Insufficient documentation

## 2015-02-09 LAB — BASIC METABOLIC PANEL
BUN: 4 mg/dL — ABNORMAL LOW (ref 6–23)
CALCIUM: 8.8 mg/dL (ref 8.4–10.5)
CO2: 23 meq/L (ref 19–32)
CREATININE: 0.6 mg/dL (ref 0.50–1.35)
Chloride: 100 mEq/L (ref 96–112)
Glucose, Bld: 141 mg/dL — ABNORMAL HIGH (ref 70–99)
Potassium: 3.7 mEq/L (ref 3.5–5.3)
SODIUM: 137 meq/L (ref 135–145)

## 2015-02-09 LAB — CK: CK TOTAL: 113 U/L (ref 7–232)

## 2015-02-09 NOTE — Progress Notes (Signed)
   Subjective:    Patient ID: Jeffrey Gray, male    DOB: [birth year], 54 y.o.   MRN: [id number]  HPI 54 year old male presents for evaluation of muscle spasm. Spanish interpreter present.  Patient reports one month history of anterior thigh muscle spasm, intermittent in nature, occurs every few days, and last for a few hours, exacerbated by activity at work, he does report pain associated with riding a recumbent bicycle, no associated weakness or numbness of the lower extremities, no bladder or bowel incontinence, Tylenol does relieve the pain, he is not attempted any heat or ice to the area  Hyperlipidemia-patient started on simvastatin at previous visit, patient has not started this medication  Of note the patient reports that as a young child he had an episode where he had pain and weakness of his lower extremities, he is uncertain of the etiology of this episode   Review of Systems  Constitutional: Negative for fever, chills and fatigue.  Respiratory: Negative for cough and shortness of breath.   Cardiovascular: Negative for chest pain and leg swelling.       Objective:   Physical Exam Vitals: Reviewed MSK: No muscle tenderness noted in the lower extremities, joints were not swollen and non-erythematous Neuro: Bilateral Strength to leg flexion and extension was 5 over 5, dorsi and plantar flexion was 5 over 5, no gross sensation deficits       Assessment & Plan:  Please see problem specific assessment and plan.

## 2015-02-09 NOTE — Assessment & Plan Note (Signed)
Bilateral intermittent anterior thigh pain for the past month. Likely not related to statin as patient has not started simvastatin. Unclear etiology and patient is currently asymptomatic. -Check basic lab work including CK and BMP -Encourage conservative management with when necessary Tylenol as well as heat to the affected area -Encouraged walking 30 minutes 3 times a week to help with muscle stamina

## 2015-02-09 NOTE — Patient Instructions (Signed)
It was nice to see you today.  Dr. Randolm IdolFletke will call you with your lab results.  Please continue to take Tylenol as needed for pain.

## 2015-02-10 ENCOUNTER — Encounter: Payer: Self-pay | Admitting: Family Medicine

## 2015-06-14 ENCOUNTER — Encounter: Payer: Self-pay | Admitting: Cardiology

## 2015-06-14 ENCOUNTER — Ambulatory Visit (INDEPENDENT_AMBULATORY_CARE_PROVIDER_SITE_OTHER): Payer: Self-pay | Admitting: Cardiology

## 2015-06-14 VITALS — BP 146/76 | HR 97 | Ht 61.0 in | Wt 142.0 lb

## 2015-06-14 DIAGNOSIS — I10 Essential (primary) hypertension: Secondary | ICD-10-CM

## 2015-06-14 DIAGNOSIS — I201 Angina pectoris with documented spasm: Secondary | ICD-10-CM

## 2015-06-14 DIAGNOSIS — R072 Precordial pain: Secondary | ICD-10-CM

## 2015-06-14 DIAGNOSIS — E785 Hyperlipidemia, unspecified: Secondary | ICD-10-CM

## 2015-06-14 NOTE — Progress Notes (Signed)
Patient ID: Jeffrey Gray, male   DOB: Dec 11, 1960, 54 y.o.   MRN: [id number]      Cardiology Office Note  Date:  06/14/2015   ID:  Jeffrey Gray, DOB 10/06/1960, MRN [id number]  PCP:  Uvaldo Rising, MD  Cardiologist:  Lars Masson, MD   Chief complain: follow up after 4 years.    History of Present Illness: Jeffrey Gray is a 54 y.o. male who presents for follow up after 4 years, he used to see Dr Shirlee Latch. He has history of possible coronary vasospasm, diabetes, HTN presents for followup. He has been doing reasonably well. He says that when he gets excited (watching a soccer match, etc) he will feel his heart pounding lasting 2 seconds and not associated with other symptoms. However, no further chest pain/tightness. He rarely uses sl NTG. His BP continues to run high, especially given his history of diabetes. No exertional dyspnea. 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    Past Medical History  Diagnosis Date  . Amputated finger     accidental, right index finger from machine  . Abdominal pain, right upper quadrant   . Alcohol abuse, unspecified   . Other and unspecified angina pectoris   . Coronary atherosclerosis of native coronary artery   . Type II or unspecified type diabetes mellitus without mention of complication, not stated as uncontrolled   . Other chronic nonalcoholic liver disease   . Other and unspecified hyperlipidemia   . Unspecified essential hypertension   . Pain in limb   . Proteinuria   . Overweight(278.02)     Past Surgical History  Procedure Laterality Date  . Abdominal cardiac cath  9/10    mild CAD, vasospasm  . Left leg fracture repair       Current Outpatient Prescriptions  Medication Sig Dispense Refill  . aspirin (ASPIRIN LOW DOSE) 81 MG EC tablet Take 81 mg by mouth daily.      Marland Kitchen lisinopril-hydrochlorothiazide (PRINZIDE,ZESTORETIC) 20-12.5 MG per tablet Take 2 tablets by mouth  daily. 180 tablet 1  . metoprolol tartrate (LOPRESSOR) 25 MG tablet TAKE ONE TABLET BY MOUTH TWICE DAILY (Patient not taking: Reported on 06/14/2015) 180 tablet 1  . nitroGLYCERIN (NITROSTAT) 0.4 MG SL tablet Place 1 tablet (0.4 mg total) under the tongue every 5 (five) minutes as needed. (Patient not taking: Reported on 02/09/2015) 20 tablet 3  . simvastatin (ZOCOR) 40 MG tablet Take 1 tablet (40 mg total) by mouth at bedtime. (Patient not taking: Reported on 02/09/2015) 90 tablet 1   No current facility-administered medications for this visit.    Allergies:   Review of patient's allergies indicates no known allergies.    Social History:  The patient  reports that he has never smoked. He has never used smokeless tobacco. He reports that he drinks about 3.6 oz of alcohol per week. He reports that he does not use illicit drugs.   Family History:  The patient's family history includes Diabetes in his brother and father; Heart attack (age of onset: 6) in his brother; Heart attack (age of onset: 39) in his father; Hypertension in his father and mother; Liver disease (age of onset: 46) in his mother; Other in his father.    ROS:  Please see the history of present illness.   Otherwise, review of systems are positive for none.   All other systems are reviewed and negative.    PHYSICAL EXAM: VS:  BP  146/76 mmHg  Pulse 97  Ht 5\' 1"  (1.549 m)  Wt 142 lb (64.411 kg)  BMI 26.84 kg/m2  SpO2 99% , BMI Body mass index is 26.84 kg/(m^2). GEN: Well nourished, well developed, in no acute distress HEENT: normal Neck: no JVD, carotid bruits, or masses Cardiac: RRR; no murmurs, rubs, or gallops,no edema  Respiratory:  clear to auscultation bilaterally, normal work of breathing GI: soft, nontender, nondistended, + BS MS: no deformity or atrophy Skin: warm and dry, no rash Neuro:  Strength and sensation are intact Psych: euthymic mood, full affect   EKG:  EKG is ordered today. The ekg ordered today  demonstrates SR, noraml ECG   Recent Labs: 11/02/2014: ALT 59* 02/09/2015: BUN 4*; Creat 0.60; Potassium 3.7; Sodium 137    Lipid Panel    Component Value Date/Time   CHOL 159 11/02/2014 1240   TRIG 51 11/02/2014 1240   HDL 60 11/02/2014 1240   CHOLHDL 2.7 11/02/2014 1240   VLDL 10 11/02/2014 1240   LDLCALC 89 11/02/2014 1240   LDLDIRECT 114* 10/31/2011 0843    Wt Readings from Last 3 Encounters:  06/14/15 142 lb (64.411 kg)  02/09/15 135 lb 8 oz (61.462 kg)  11/02/14 138 lb 14.4 oz (63.005 kg)     ASSESSMENT AND PLAN:  1. CORONARY ARTERY SPASM (ICD-413.9) Possible coronary vasospasm as explanation of patient's prior chest pain. This seems to have resolved with amlodipine use. Now asymptomatic and normal ECG.   2. HYPERLIPIDEMIA (ICD-272.4) Lipids ok when last checked in March 2016.   3. HYPERTENSION (ICD-401.9) BP borderline, ok when rechecked, I will continue the same regimen.   Follow up in 1 year.  Signed, Lars MassonNELSON, Styles Fambro H, MD  06/14/2015 2:32 PM    [facility] [address], [place], [place]  [address] Phone: [phone]; Fax: [phone]

## 2015-06-14 NOTE — Patient Instructions (Signed)
Your physician recommends that you continue on your current medications as directed. Please refer to the Current Medication list given to you today.   Your physician wants you to follow-up in: ONE YEAR WITH DR NELSON You will receive a reminder letter in the mail two months in advance. If you don't receive a letter, please call our office to schedule the follow-up appointment.  

## 2015-11-25 ENCOUNTER — Other Ambulatory Visit: Payer: Self-pay | Admitting: Family Medicine

## 2016-08-30 ENCOUNTER — Other Ambulatory Visit: Payer: Self-pay | Admitting: Family Medicine

## 2016-11-25 ENCOUNTER — Ambulatory Visit (HOSPITAL_COMMUNITY)
Admission: EM | Admit: 2016-11-25 | Discharge: 2016-11-25 | Disposition: A | Discharge status: Home / Self Care | Payer: Self-pay | Attending: Family Medicine | Admitting: Family Medicine

## 2016-11-25 ENCOUNTER — Encounter (HOSPITAL_COMMUNITY): Payer: Self-pay | Admitting: Emergency Medicine

## 2016-11-25 DIAGNOSIS — E785 Hyperlipidemia, unspecified: Secondary | ICD-10-CM | POA: Insufficient documentation

## 2016-11-25 DIAGNOSIS — R739 Hyperglycemia, unspecified: Secondary | ICD-10-CM

## 2016-11-25 DIAGNOSIS — I2510 Atherosclerotic heart disease of native coronary artery without angina pectoris: Secondary | ICD-10-CM | POA: Insufficient documentation

## 2016-11-25 DIAGNOSIS — R350 Frequency of micturition: Secondary | ICD-10-CM | POA: Insufficient documentation

## 2016-11-25 DIAGNOSIS — Z8379 Family history of other diseases of the digestive system: Secondary | ICD-10-CM | POA: Insufficient documentation

## 2016-11-25 DIAGNOSIS — Z79899 Other long term (current) drug therapy: Secondary | ICD-10-CM | POA: Insufficient documentation

## 2016-11-25 DIAGNOSIS — I129 Hypertensive chronic kidney disease with stage 1 through stage 4 chronic kidney disease, or unspecified chronic kidney disease: Secondary | ICD-10-CM | POA: Insufficient documentation

## 2016-11-25 DIAGNOSIS — R300 Dysuria: Secondary | ICD-10-CM | POA: Insufficient documentation

## 2016-11-25 DIAGNOSIS — E1122 Type 2 diabetes mellitus with diabetic chronic kidney disease: Secondary | ICD-10-CM | POA: Insufficient documentation

## 2016-11-25 DIAGNOSIS — N181 Chronic kidney disease, stage 1: Secondary | ICD-10-CM | POA: Insufficient documentation

## 2016-11-25 DIAGNOSIS — E1165 Type 2 diabetes mellitus with hyperglycemia: Secondary | ICD-10-CM | POA: Insufficient documentation

## 2016-11-25 DIAGNOSIS — Z833 Family history of diabetes mellitus: Secondary | ICD-10-CM | POA: Insufficient documentation

## 2016-11-25 DIAGNOSIS — Z8249 Family history of ischemic heart disease and other diseases of the circulatory system: Secondary | ICD-10-CM | POA: Insufficient documentation

## 2016-11-25 DIAGNOSIS — K769 Liver disease, unspecified: Secondary | ICD-10-CM | POA: Insufficient documentation

## 2016-11-25 DIAGNOSIS — E119 Type 2 diabetes mellitus without complications: Secondary | ICD-10-CM

## 2016-11-25 LAB — POCT URINALYSIS DIP (DEVICE)
BILIRUBIN URINE: NEGATIVE
GLUCOSE, UA: 500 mg/dL — AB
LEUKOCYTES UA: NEGATIVE
Nitrite: NEGATIVE
PROTEIN: 30 mg/dL — AB
SPECIFIC GRAVITY, URINE: 1.015 (ref 1.005–1.030)
Urobilinogen, UA: 4 mg/dL — ABNORMAL HIGH (ref 0.0–1.0)
pH: 7 (ref 5.0–8.0)

## 2016-11-25 LAB — GLUCOSE, CAPILLARY: GLUCOSE-CAPILLARY: 228 mg/dL — AB (ref 65–99)

## 2016-11-25 MED ORDER — METFORMIN HCL 500 MG PO TABS: 500.0000 mg | ORAL_TABLET | Freq: Two times a day (BID) | ORAL | 0 refills | Status: DC

## 2016-11-25 MED ORDER — PHENAZOPYRIDINE HCL 200 MG PO TABS: 200.0000 mg | ORAL_TABLET | Freq: Three times a day (TID) | ORAL | 0 refills | Status: DC

## 2016-11-25 NOTE — ED Provider Notes (Signed)
CSN: [id number]     Arrival date & time 11/25/16  1939 History   None    Chief Complaint  Patient presents with  . Urinary Tract Infection   (Consider location/radiation/quality/duration/timing/severity/associated sxs/prior Treatment) Patient    The history is provided by the patient.  Urinary Tract Infection  This is a new problem. The problem occurs constantly. Nothing aggravates the symptoms. Nothing relieves the symptoms. He has tried nothing for the symptoms.    Past Medical History:  Diagnosis Date  . Abdominal pain, right upper quadrant   . Alcohol abuse, unspecified   . Amputated finger    accidental, right index finger from machine  . Coronary atherosclerosis of native coronary artery   . Other and unspecified angina pectoris   . Other and unspecified hyperlipidemia   . Other chronic nonalcoholic liver disease   . Overweight(278.02)   . Pain in limb   . Proteinuria   . Type II or unspecified type diabetes mellitus without mention of complication, not stated as uncontrolled   . Unspecified essential hypertension    Past Surgical History:  Procedure Laterality Date  . abdominal cardiac cath  9/10   mild CAD, vasospasm  . left leg fracture repair     Family History  Problem Relation Age of Onset  . Heart attack Brother 54  . Hypertension Mother   . Heart attack Father 8  . Diabetes Brother   . Liver disease Mother 26  . Hypertension Father   . Diabetes Father   . Other Father     colon issues   Social History  Substance Use Topics  . Smoking status: Never Smoker  . Smokeless tobacco: Never Used  . Alcohol use 3.6 oz/week    6 Cans of beer per week     Comment: former heavy drinker, has cut back    Review of Systems  Constitutional: Negative.   HENT: Negative.   Eyes: Negative.   Respiratory: Negative.   Cardiovascular: Negative.   Gastrointestinal: Negative.   Endocrine: Negative.   Genitourinary: Negative.   Musculoskeletal: Negative.    Allergic/Immunologic: Negative.   Neurological: Negative.   Hematological: Negative.   Psychiatric/Behavioral: Negative.     Allergies  Patient has no known allergies.  Home Medications   Prior to Admission medications   Medication Sig Start Date End Date Taking? Authorizing Provider  lisinopril-hydrochlorothiazide (PRINZIDE,ZESTORETIC) 20-12.5 MG tablet TAKE TWO TABLETS BY MOUTH ONCE DAILY 08/31/16  Yes Uvaldo Rising, MD  aspirin (ASPIRIN LOW DOSE) 81 MG EC tablet Take 81 mg by mouth daily.      Historical Provider, MD  metFORMIN (GLUCOPHAGE) 500 MG tablet Take 1 tablet (500 mg total) by mouth 2 (two) times daily with a meal. 11/25/16   Deatra Canter, FNP  metoprolol tartrate (LOPRESSOR) 25 MG tablet TAKE ONE TABLET BY MOUTH TWICE DAILY Patient not taking: Reported on 06/14/2015 11/04/14   Uvaldo Rising, MD  nitroGLYCERIN (NITROSTAT) 0.4 MG SL tablet Place 1 tablet (0.4 mg total) under the tongue every 5 (five) minutes as needed. Patient not taking: Reported on 02/09/2015 08/12/12   Zachery Dauer, MD  phenazopyridine (PYRIDIUM) 200 MG tablet Take 1 tablet (200 mg total) by mouth 3 (three) times daily. 11/25/16   Deatra Canter, FNP  simvastatin (ZOCOR) 40 MG tablet Take 1 tablet (40 mg total) by mouth at bedtime. Patient not taking: Reported on 02/09/2015 11/02/14   Uvaldo Rising, MD   Meds Ordered and Administered this Visit  Medications - No data to display  BP (!) 184/98 (BP Location: Right Arm)   Pulse 76   Temp 99.3 F (37.4 C) (Oral)   SpO2 99%  No data found.   Physical Exam  Constitutional: He is oriented to person, place, and time. He appears well-developed and well-nourished.  HENT:  Head: Normocephalic.  Right Ear: External ear normal.  Left Ear: External ear normal.  Mouth/Throat: Oropharynx is clear and moist.  Eyes: Conjunctivae and EOM are normal. Pupils are equal, round, and reactive to light.  Neck: Normal range of motion. Neck supple.  Cardiovascular: Normal  rate, regular rhythm and normal heart sounds.   Pulmonary/Chest: Effort normal and breath sounds normal.  Abdominal: Soft. Bowel sounds are normal.  Genitourinary: Penis normal.  Musculoskeletal: Normal range of motion.  Neurological: He is alert and oriented to person, place, and time.  Nursing note and vitals reviewed.   Urgent Care Course     Procedures (including critical care time)  Labs Review Labs Reviewed  GLUCOSE, CAPILLARY - Abnormal; Notable for the following:       Result Value   Glucose-Capillary 228 (*)    All other components within normal limits  POCT URINALYSIS DIP (DEVICE) - Abnormal; Notable for the following:    Glucose, UA 500 (*)    Ketones, ur TRACE (*)    Hgb urine dipstick TRACE (*)    Protein, ur 30 (*)    Urobilinogen, UA 4.0 (*)    All other components within normal limits  URINE CULTURE  URINE CYTOLOGY ANCILLARY ONLY    Imaging Review No results found.   Visual Acuity Review  Right Eye Distance:   Left Eye Distance:   Bilateral Distance:    Right Eye Near:   Left Eye Near:    Bilateral Near:         MDM   1. Dysuria   2. Urinary frequency   3. Hyperglycemia   4. 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

## 2016-11-25 NOTE — ED Triage Notes (Addendum)
Pt complains of lower abdominal pain, pain with urination and an brownish/orange colored urine for 3 days.  Pt denies any fever.  Pt's daughter reports some disorientation and fatigue.

## 2016-11-27 LAB — URINE CYTOLOGY ANCILLARY ONLY
Chlamydia: NEGATIVE
Neisseria Gonorrhea: NEGATIVE
Trichomonas: NEGATIVE

## 2016-11-28 ENCOUNTER — Ambulatory Visit (INDEPENDENT_AMBULATORY_CARE_PROVIDER_SITE_OTHER): Payer: Self-pay | Admitting: Student

## 2016-11-28 ENCOUNTER — Encounter: Payer: Self-pay | Admitting: Student

## 2016-11-28 VITALS — BP 140/86 | HR 73 | Temp 98.4°F | Ht 61.0 in | Wt 140.6 lb

## 2016-11-28 DIAGNOSIS — E118 Type 2 diabetes mellitus with unspecified complications: Secondary | ICD-10-CM

## 2016-11-28 LAB — POCT GLYCOSYLATED HEMOGLOBIN (HGB A1C): HEMOGLOBIN A1C: 6

## 2016-11-28 MED ORDER — LISINOPRIL-HYDROCHLOROTHIAZIDE 20-12.5 MG PO TABS: 2.0000 | ORAL_TABLET | Freq: Every day | ORAL | 1 refills | Status: DC

## 2016-11-28 MED ORDER — SIMVASTATIN 40 MG PO TABS: 40.0000 mg | ORAL_TABLET | Freq: Every day | ORAL | 1 refills | Status: DC

## 2016-11-28 MED ORDER — ASPIRIN 81 MG PO TBEC
81.0000 mg | DELAYED_RELEASE_TABLET | Freq: Every day | ORAL | 3 refills | Status: AC
Start: 2016-11-28 — End: ?

## 2016-11-28 MED ORDER — METOPROLOL TARTRATE 25 MG PO TABS: 25.0000 mg | ORAL_TABLET | Freq: Two times a day (BID) | ORAL | 1 refills | Status: DC

## 2016-11-28 NOTE — Progress Notes (Signed)
   Subjective:    Patient ID: Jeffrey Gray, male    DOB: [birth year], 56 y.o.   MRN: [id number]   CC: elevated blood pressure and CBG  HPI: 56 y/o Male presents for elevated blood glucose and blood pressure  Elevated blood glucose - this was noted when he went to urgent care for abdominal pain - he work up was negative and his abd pain resolved but it was noted his BP was elevated to 184/98 - since then he has been taking his lisinopril-HCTZ but not metoprolol - no headaches, chest pain, or SOB  Elevated CBG - his CBG was notyed ot be elevated in the 200s - he has not taken his metformin for approximately 2 year  Prior to being seen at urgent care and it being refilled for him  HLD - has not been taking his zocor  Smoking status reviewed  Review of Systems  Per HPI   Objective:  BP 140/86 (BP Location: Right Arm)   Pulse 73   Temp 98.4 F (36.9 C) (Oral)   Ht  (1.549 m)   Wt 140 lb 9.6 oz (63.8 kg)   SpO2 99%   BMI 26.57 kg/m  Vitals and nursing note reviewed  General: NAD Cardiac: RRR,  Respiratory: CTAB, normal effort Abdomen: soft, nontender, nondistended, no hepatic or splenomegaly. Bowel sounds present Neuro: alert and oriented, no focal deficits   Assessment & Plan:    Diabetes mellitus type 2 with complications A1c today 6. patient states he has enough metformin from refill 3 days ago - he was strongly counseled to follow with his PCP  Hyperlipidemia Zocor refilled. Lipid panel today  HYPERTENSION BP initially elevated to 160/82 then improved to 140/86.  - metoprolol refilled - patient states he has enough prinzide so this was not ordered - patient to follow with PCP    Alyssa A. Kennon Rounds MD, MS Family Medicine Resident PGY-3 Pager [phone]

## 2016-11-28 NOTE — Assessment & Plan Note (Addendum)
Zocor refilled. Lipid panel today

## 2016-11-28 NOTE — Patient Instructions (Signed)
Follow up with Dr Randolm Idol in the next 1-2 weeks Call the office with questions or concerns

## 2016-11-28 NOTE — Assessment & Plan Note (Signed)
BP initially elevated to 160/82 then improved to 140/86.  - metoprolol refilled - patient states he has enough prinzide so this was not ordered - patient to follow with PCP

## 2016-11-28 NOTE — Assessment & Plan Note (Signed)
A1c today 6. patient states he has enough metformin from refill 3 days ago - he was strongly counseled to follow with his PCP

## 2016-11-29 LAB — LIPID PANEL
Chol/HDL Ratio: 2.3 ratio (ref 0.0–5.0)
Cholesterol, Total: 163 mg/dL (ref 100–199)
HDL: 71 mg/dL (ref >39)
LDL Calculated: 80 mg/dL (ref 0–99)
Triglycerides: 61 mg/dL (ref 0–149)
VLDL Cholesterol Cal: 12 mg/dL (ref 5–40)

## 2016-11-30 LAB — URINE CYTOLOGY ANCILLARY ONLY
Bacterial vaginitis: NEGATIVE
Candida vaginitis: NEGATIVE

## 2016-12-13 NOTE — Progress Notes (Signed)
   Subjective:    Patient ID: Jeffrey MooreJulian Gray, male    DOB: [birth year], 56 y.o.   MRN: [id number]  HPI 56 y/o Hispanic male presents for routine follow up. Seen at urgent care on 4/21 and by Dr. Kennon [facility] on 4/24. Reviewed these notes.   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 Kennon RoundsHaney on 4/24)  HM Due for DM eye exam (went to eye doctor 4 years ago, [facility]) and DM foot exam   Social Drinking four 24 ounces beer per day, has tried to cut down since last office visit.    Review of Systems  Constitutional: Negative for chills and fever.  Gastrointestinal: Negative for abdominal pain, nausea and vomiting.       Objective:   Physical Exam BP (!) 142/78   Pulse 62   Temp 98.5 F (36.9 C) (Oral)   Ht 5\' 1"  (1.549 m)   Wt 140 lb 9.6 oz (63.8 kg)   SpO2 99%   BMI 26.57 kg/m   Gen: pleasant male, NAD HEENT: normocephalic, PERRL, EOMI, no scleral icterus, MMM, uvula midline, neck supple, no adenopathy Cardiac: RRR, S1 and S2 present, no murmur Resp: CTAB, normal effort Foot Exam: 2+ DP pulses, no ulcerations, intact sensation to monofilament testing  A1C 6.0 on 11/28/16 Lipid Profile 11/28/16 (total cholesterol 163, Trig 61, HDL 71, LDL 80) ASCVD 10 year risk score of 8.3    Assessment & Plan:  Diabetes mellitus type 2 with complications Controlled. Last A1C 6.0. -continue Metformin 500 mg daily (had  been prescribed twice daily but was only taking once daily) -foot exam unremarkable today -patient to bring in copy from recent eye exam  Alcoholism Drinks up to four 24 oz. beers per day. Attempting to cut down from last visit. -encouraged cessation -check CMP today   Hyperlipidemia ASCVD risk score of 8.3 based on recent lipid profile. -continue Simvastatin 40 mg daily and daily ASA 81 mg  Essential hypertension Mildly elevated at 142/78 -non changes in medications today as just recently restarted Metoprolol -continue Lisinopril-HCTZ 20-12.5 two tablets daily and Metoprolol 25 mg BID  -encouraged formal exercise including walking/biking on weekends in addition to work on [employer] representative[employer] site

## 2016-12-14 ENCOUNTER — Ambulatory Visit (INDEPENDENT_AMBULATORY_CARE_PROVIDER_SITE_OTHER): Payer: Self-pay | Admitting: Family Medicine

## 2016-12-14 ENCOUNTER — Encounter: Payer: Self-pay | Admitting: Family Medicine

## 2016-12-14 DIAGNOSIS — K760 Fatty (change of) liver, not elsewhere classified: Secondary | ICD-10-CM

## 2016-12-14 DIAGNOSIS — E118 Type 2 diabetes mellitus with unspecified complications: Secondary | ICD-10-CM

## 2016-12-14 DIAGNOSIS — E785 Hyperlipidemia, unspecified: Secondary | ICD-10-CM

## 2016-12-14 DIAGNOSIS — I10 Essential (primary) hypertension: Secondary | ICD-10-CM

## 2016-12-14 DIAGNOSIS — F1020 Alcohol dependence, uncomplicated: Secondary | ICD-10-CM

## 2016-12-14 MED ORDER — METFORMIN HCL 500 MG PO TABS: 500.0000 mg | ORAL_TABLET | Freq: Every day | ORAL | 0 refills | Status: DC

## 2016-12-14 NOTE — Assessment & Plan Note (Addendum)
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

## 2016-12-14 NOTE — Assessment & Plan Note (Signed)
ASCVD risk score of 8.3 based on recent lipid profile. -continue Simvastatin 40 mg daily and daily ASA 81 mg

## 2016-12-14 NOTE — Assessment & Plan Note (Signed)
Drinks up to four 24 oz. beers per day. Attempting to cut down from last visit. -encouraged cessation -check CMP today

## 2016-12-14 NOTE — Assessment & Plan Note (Signed)
Controlled. Last A1C 6.0. -continue Metformin 500 mg daily (had been prescribed twice daily but was only taking once daily) -foot exam unremarkable today -patient to bring in copy from recent eye exam

## 2016-12-14 NOTE — Patient Instructions (Addendum)
It was nice to see you today.  Please bring me a copy of your last eye exam. Dr. Randolm IdolFletke will call your with your lab results.   Please continue to cut down on your alcohol intake.   Return in 1 month for recheck of blood pressure.

## 2016-12-15 LAB — CMP14+EGFR
A/G RATIO: 1 — AB (ref 1.2–2.2)
ALT: 50 IU/L — AB (ref 0–44)
AST: 39 IU/L (ref 0–40)
Albumin: 4.1 g/dL (ref 3.5–5.5)
Alkaline Phosphatase: 94 IU/L (ref 39–117)
BUN/Creatinine Ratio: 7 — ABNORMAL LOW (ref 9–20)
BUN: 5 mg/dL — AB (ref 6–24)
Bilirubin Total: 0.4 mg/dL (ref 0.0–1.2)
CO2: 22 mmol/L (ref 18–29)
CREATININE: 0.73 mg/dL — AB (ref 0.76–1.27)
Calcium: 9.6 mg/dL (ref 8.7–10.2)
Chloride: 92 mmol/L — ABNORMAL LOW (ref 96–106)
GFR, EST AFRICAN AMERICAN: 121 mL/min/{1.73_m2} (ref >59)
GFR, EST NON AFRICAN AMERICAN: 104 mL/min/{1.73_m2} (ref >59)
Globulin, Total: 4 g/dL (ref 1.5–4.5)
Glucose: 124 mg/dL — ABNORMAL HIGH (ref 65–99)
POTASSIUM: 4.3 mmol/L (ref 3.5–5.2)
Sodium: 132 mmol/L — ABNORMAL LOW (ref 134–144)
TOTAL PROTEIN: 8.1 g/dL (ref 6.0–8.5)

## 2016-12-18 ENCOUNTER — Encounter: Payer: Self-pay | Admitting: Family Medicine

## 2016-12-18 DIAGNOSIS — R7401 Elevation of levels of liver transaminase levels: Secondary | ICD-10-CM | POA: Insufficient documentation

## 2016-12-18 DIAGNOSIS — R740 Nonspecific elevation of levels of transaminase and lactic acid dehydrogenase [LDH]: Principal | ICD-10-CM

## 2016-12-18 HISTORY — DX: Elevation of levels of liver transaminase levels: R74.01

## 2017-01-10 NOTE — Progress Notes (Signed)
   Subjective:    Patient ID: Jeffrey MooreJulian Gray, male    DOB: July 11, 1961, 56 y.o.   MRN: [id number]  HPI 56 y/o Hispanic male presents for follow up of HTN. Spanish Video Interpretor utilized to obtain HPI.   HTN Currently taking Lisinopril-HCTZ 20-12.5 two tablets daily and Metoprolol 25 mg BID (only taking once per day). No chest pain, no changes in vision, no headaches  Alcohol abuse Previously drinking up to four 24 oz beers per day. Down to two 12 oz beers per day. Will have 2-3 days per week where he does not drink beer.   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), present for 2 weeks, has not attempted any otc medication or ice/heat, denies injury  HM Due for DM eye exam (patient reports exam completed in past 6 months) - plans to go today to get the records  Social Never a smoker  Review of Systems See above    Objective:   Physical Exam BP 140/80   Pulse 70   Temp 98.5 F (36.9 C) (Oral)   Ht 5\' 1"  (1.549 m)   Wt 143 lb 12.8 oz (65.2 kg)   SpO2 98%   BMI 27.17 kg/m   Gen: pleasant Hispanic male Cardiac: RRR, S1 and S2 present, no murmur Resp: CTAB, normal effort MSK: left paraspinal thoracic tenderness, no midline tenderness Skin: no rash, no bruising     Assessment & Plan:  Essential hypertension Mild elevated today likely due to patient only taking Metoprolol once daily. -increase Metoprolol to 25 mg BID -continue Lisinopril-HCTZ 20-12.5 two tablets daily  Alcoholism Patient has cut down to an average of two 12 oz. Beers per day.  -congratulated patient on cutting down  Thoracic back pain Left thoracic paraspinal muscle pain consistent with muscle strain. -encouraged heat as tolerated -may take Ibuprofen 400 TID prn -return precautions discussed

## 2017-01-12 ENCOUNTER — Encounter: Payer: Self-pay | Admitting: Family Medicine

## 2017-01-12 ENCOUNTER — Ambulatory Visit (INDEPENDENT_AMBULATORY_CARE_PROVIDER_SITE_OTHER): Payer: Self-pay | Admitting: Family Medicine

## 2017-01-12 DIAGNOSIS — I10 Essential (primary) hypertension: Secondary | ICD-10-CM

## 2017-01-12 DIAGNOSIS — F1020 Alcohol dependence, uncomplicated: Secondary | ICD-10-CM

## 2017-01-12 DIAGNOSIS — M546 Pain in thoracic spine: Secondary | ICD-10-CM

## 2017-01-12 NOTE — Assessment & Plan Note (Signed)
Left thoracic paraspinal muscle pain consistent with muscle strain. -encouraged heat as tolerated -may take Ibuprofen 400 TID prn -return precautions discussed

## 2017-01-12 NOTE — Assessment & Plan Note (Signed)
Mild elevated today likely due to patient only taking Metoprolol once daily. -increase Metoprolol to 25 mg BID -continue Lisinopril-HCTZ 20-12.5 two tablets daily

## 2017-01-12 NOTE — Assessment & Plan Note (Signed)
Patient has cut down to an average of two 12 oz. Beers per day.  -congratulated patient on cutting down

## 2017-01-12 NOTE — Patient Instructions (Addendum)
It was nice to see you today.  Blood Pressure - increase Metoprolol to twice daily, continue to take Lisinopril-HCTZ two tablets twice daily.  Back Pain - likely muscle spasm, may take Ibuprofen 400 mg up to 3 times per day as needed, apply heat at night. \   Please make a nurse visit to have your blood pressure recheck in 2 weeks.   Please make an appointment with your physician in 3 months for a physical.

## 2017-07-11 ENCOUNTER — Ambulatory Visit: Payer: Self-pay | Admitting: Family Medicine

## 2017-08-14 ENCOUNTER — Ambulatory Visit: Payer: Self-pay | Admitting: Family Medicine

## 2017-08-14 ENCOUNTER — Encounter: Payer: Self-pay | Admitting: Family Medicine

## 2017-08-14 ENCOUNTER — Other Ambulatory Visit: Payer: Self-pay

## 2017-08-14 VITALS — BP 148/80 | HR 63 | Temp 98.1°F | Wt 137.8 lb

## 2017-08-14 DIAGNOSIS — M25569 Pain in unspecified knee: Secondary | ICD-10-CM | POA: Insufficient documentation

## 2017-08-14 DIAGNOSIS — I10 Essential (primary) hypertension: Secondary | ICD-10-CM

## 2017-08-14 DIAGNOSIS — E785 Hyperlipidemia, unspecified: Secondary | ICD-10-CM

## 2017-08-14 DIAGNOSIS — R740 Nonspecific elevation of levels of transaminase and lactic acid dehydrogenase [LDH]: Secondary | ICD-10-CM

## 2017-08-14 DIAGNOSIS — R7401 Elevation of levels of liver transaminase levels: Secondary | ICD-10-CM

## 2017-08-14 DIAGNOSIS — M25562 Pain in left knee: Secondary | ICD-10-CM

## 2017-08-14 DIAGNOSIS — F1020 Alcohol dependence, uncomplicated: Secondary | ICD-10-CM

## 2017-08-14 DIAGNOSIS — M25561 Pain in right knee: Secondary | ICD-10-CM

## 2017-08-14 DIAGNOSIS — E118 Type 2 diabetes mellitus with unspecified complications: Secondary | ICD-10-CM

## 2017-08-14 LAB — POCT GLYCOSYLATED HEMOGLOBIN (HGB A1C): Hemoglobin A1C: 6.3

## 2017-08-14 NOTE — Assessment & Plan Note (Signed)
Not taking simvastatin.  Will check labs

## 2017-08-14 NOTE — Assessment & Plan Note (Signed)
Last drink more than 1 month ago

## 2017-08-14 NOTE — Patient Instructions (Addendum)
Good to see you today!  Thanks for coming in.  For the leg pain Use arch support insoles in all your shoes Use tylenol as needed  I will call you if your tests are not good.  Otherwise I will send you a letter.  If you do not hear from me with in 2 weeks please call our office.      Come back in 6 months for a diabetes check   For the Hypertension Take metoprolol twice a day    ==================  Es bueno verte hoy! Gracias por venir.  Por el dolor de pierna [place] plantillas de soporte de arco en todos tus zapatos. Use tylenol segn sea necesario  Te llamar si tus pruebas no son buenas. De lo contrario te Engineer, drillingenviar una carta. Si no tiene noticias mas en 2 semanas, llame a nuestra oficina.   Regrese en 6 meses para un control de diabetes  Para la hipertension Tomar metoprolol dos veces al [facility] [facility]

## 2017-08-14 NOTE — Assessment & Plan Note (Signed)
New.  Normal exam.  Unsure of cause.  Check CK.  See after visit summary

## 2017-08-14 NOTE — Assessment & Plan Note (Signed)
Unsure of control as did not take medications today and was taking metoprolol daily only. Will follow

## 2017-08-14 NOTE — Progress Notes (Signed)
Subjective  Patient is presenting with the following illnesses Translator Helmut Musterlicia [phone]  LEG PAIN Location: both legs intermittently.  Can't seem to think of specific triggers other that at work.   Course:  Comes and goes Worse with:  exercise Better with:  tylenol Trauma: no Swelling:  no Locking:  no Other Joints involved:  no Rash: no Fever:  no  HYPERTENSION Disease Monitoring  Home BP Monitoring (Severity) not checking  Symptoms - Chest pain- no    Dyspnea- no Medications (Modifying factors) Compliance-  Did not take his medications today. Lightheadedness-  no  Edema- no Timing - continuous  Duration - years ROS - See HPI  PMH Lab Review   Potassium  Date Value Ref Range Status  12/14/2016 4.3 3.5 - 5.2 mmol/L Final   Sodium  Date Value Ref Range Status  12/14/2016 132 (L) 134 - 144 mmol/L Final   Creat  Date Value Ref Range Status  02/09/2015 0.60 0.50 - 1.35 mg/dL Final   Creatinine, Ser  Date Value Ref Range Status  12/14/2016 0.73 (L) 0.76 - 1.27 mg/dL Final         DIABETES Disease Monitoring: Blood Sugar ranges(Severity) -not checking  Associated Symptoms- Polyuria/phagia/dipsia- no      Visual problems- no Medications: Compliance(Modifying factor) - metformin regularly Hypoglycemic symptoms- no Timing - continuous  Monitoring Labs and Parameters Last A1C:  Lab Results  Component Value Date   HGBA1C 6.3 08/14/2017   Last Lipid:     Component Value Date/Time   CHOL 163 11/28/2016 0920   HDL 71 11/28/2016 0920   LDLDIRECT 114 (H) 10/31/2011 0843   Last Bmet  Potassium  Date Value Ref Range Status  12/14/2016 4.3 3.5 - 5.2 mmol/L Final   Sodium  Date Value Ref Range Status  12/14/2016 132 (L) 134 - 144 mmol/L Final   Creat  Date Value Ref Range Status  02/09/2015 0.60 0.50 - 1.35 mg/dL Final   Creatinine, Ser  Date Value Ref Range Status  12/14/2016 0.73 (L) 0.76 - 1.27 mg/dL Final     Chief Complaint noted Review of Symptoms  - see HPI PMH - Smoking status noted.     Objective Vital Signs reviewed Able to walk on heels and toes and do deep knee bend Balance normal  Heart - Regular rate and rhythm.  No murmurs, gallops or rubs.    Lungs:  Normal respiratory effort, chest expands symmetrically. Lungs are clear to auscultation, no crackles or wheezes. Extremities:  No cyanosis, edema, or deformity noted with good range of motion of all major joints.        Assessments/Plans  Alcoholism Last drink more than 1 month ago  Pain in joint, lower leg New.  Normal exam.  Unsure of cause.  Check CK.  See after visit summary   Essential hypertension Unsure of control as did not take medications today and was taking metoprolol daily only. Will follow   Diabetes mellitus type 2 with complications Good control   Hyperlipidemia Not taking simvastatin.  Will check labs    See after visit summary for details of patient instuctions

## 2017-08-14 NOTE — Assessment & Plan Note (Signed)
Good control

## 2017-08-15 ENCOUNTER — Encounter: Payer: Self-pay | Admitting: Family Medicine

## 2017-08-15 LAB — CMP14+EGFR
ALT: 43 IU/L (ref 0–44)
AST: 28 IU/L (ref 0–40)
Albumin/Globulin Ratio: 1.1 — ABNORMAL LOW (ref 1.2–2.2)
Albumin: 4.1 g/dL (ref 3.5–5.5)
Alkaline Phosphatase: 108 IU/L (ref 39–117)
BUN/Creatinine Ratio: 8 — ABNORMAL LOW (ref 9–20)
BUN: 6 mg/dL (ref 6–24)
Bilirubin Total: 0.3 mg/dL (ref 0.0–1.2)
CO2: 24 mmol/L (ref 20–29)
Calcium: 9.2 mg/dL (ref 8.7–10.2)
Chloride: 95 mmol/L — ABNORMAL LOW (ref 96–106)
Creatinine, Ser: 0.73 mg/dL — ABNORMAL LOW (ref 0.76–1.27)
GFR calc Af Amer: 121 mL/min/{1.73_m2} (ref >59)
GFR calc non Af Amer: 104 mL/min/{1.73_m2} (ref >59)
Globulin, Total: 3.8 g/dL (ref 1.5–4.5)
Glucose: 105 mg/dL — ABNORMAL HIGH (ref 65–99)
Potassium: 4.1 mmol/L (ref 3.5–5.2)
Sodium: 134 mmol/L (ref 134–144)
Total Protein: 7.9 g/dL (ref 6.0–8.5)

## 2017-08-15 LAB — LIPID PANEL
Chol/HDL Ratio: 3.5 ratio (ref 0.0–5.0)
Cholesterol, Total: 153 mg/dL (ref 100–199)
HDL: 44 mg/dL (ref >39)
LDL Calculated: 90 mg/dL (ref 0–99)
Triglycerides: 93 mg/dL (ref 0–149)
VLDL Cholesterol Cal: 19 mg/dL (ref 5–40)

## 2017-08-15 LAB — CK: Total CK: 56 U/L (ref 24–204)

## 2018-02-11 ENCOUNTER — Other Ambulatory Visit: Payer: Self-pay

## 2018-02-12 MED ORDER — METOPROLOL TARTRATE 25 MG PO TABS: 25.0000 mg | ORAL_TABLET | Freq: Two times a day (BID) | ORAL | 1 refills | Status: DC

## 2018-03-07 ENCOUNTER — Other Ambulatory Visit: Payer: Self-pay

## 2018-03-07 MED ORDER — LISINOPRIL-HYDROCHLOROTHIAZIDE 20-12.5 MG PO TABS: 2.0000 | ORAL_TABLET | Freq: Every day | ORAL | 0 refills | Status: DC

## 2018-04-08 ENCOUNTER — Other Ambulatory Visit: Payer: Self-pay | Admitting: Family Medicine

## 2018-06-12 ENCOUNTER — Other Ambulatory Visit: Payer: Self-pay | Admitting: Family Medicine

## 2018-10-02 ENCOUNTER — Ambulatory Visit (INDEPENDENT_AMBULATORY_CARE_PROVIDER_SITE_OTHER): Payer: Self-pay | Admitting: Family Medicine

## 2018-10-02 ENCOUNTER — Ambulatory Visit (HOSPITAL_COMMUNITY)
Admission: RE | Admit: 2018-10-02 | Discharge: 2018-10-02 | Disposition: A | Discharge status: Home / Self Care | Payer: Self-pay | Source: Ambulatory Visit | Attending: Family Medicine | Admitting: Family Medicine

## 2018-10-02 VITALS — BP 160/99 | HR 101 | Temp 98.4°F | Wt 144.8 lb

## 2018-10-02 DIAGNOSIS — F1020 Alcohol dependence, uncomplicated: Secondary | ICD-10-CM

## 2018-10-02 DIAGNOSIS — E1169 Type 2 diabetes mellitus with other specified complication: Secondary | ICD-10-CM | POA: Insufficient documentation

## 2018-10-02 DIAGNOSIS — E785 Hyperlipidemia, unspecified: Secondary | ICD-10-CM

## 2018-10-02 DIAGNOSIS — R079 Chest pain, unspecified: Secondary | ICD-10-CM

## 2018-10-02 DIAGNOSIS — I10 Essential (primary) hypertension: Secondary | ICD-10-CM

## 2018-10-02 HISTORY — DX: Hyperlipidemia, unspecified: E78.5

## 2018-10-02 HISTORY — DX: Hyperlipidemia, unspecified: E11.69

## 2018-10-02 MED ORDER — METOPROLOL TARTRATE 25 MG PO TABS: 25.0000 mg | ORAL_TABLET | Freq: Two times a day (BID) | ORAL | 0 refills | Status: DC

## 2018-10-02 MED ORDER — ATORVASTATIN CALCIUM 20 MG PO TABS: 20.0000 mg | ORAL_TABLET | Freq: Every day | ORAL | 3 refills | Status: DC

## 2018-10-02 MED ORDER — LISINOPRIL-HYDROCHLOROTHIAZIDE 20-12.5 MG PO TABS: 2.0000 | ORAL_TABLET | Freq: Every day | ORAL | 0 refills | Status: DC

## 2018-10-02 NOTE — Assessment & Plan Note (Signed)
Chronic.  Poor control due to nonadherence to antihypertensive therapy.  Non-smoker.  Likely exacerbated by alcohol use disorder. - Given refill for Lopressor 25 mg twice daily and lisinopril-HCTZ 20-12.5 mg twice daily - See plan for alcohol use disorder

## 2018-10-02 NOTE — Assessment & Plan Note (Signed)
Chronic.  No recent statin use, although simvastatin is on his medication list.  No known statin induced myalgias.  Suspect chronic low back pain and knee pain is related to occupation.  Myoview in 2013 without obstruction coronaries, however new onset chest pain concerning for ACS. - See plan for chest pain - Initiating Lipitor 20 mg daily plans to increase if tolerated

## 2018-10-02 NOTE — Patient Instructions (Addendum)
Gracias por venir a vernos hoy. Consulte a continuacin para revisar nuestro plan para la visita de hoy.  1. Llam a rellenar sus medicamentos. Es importante que los tome segn lo recetado. 2. Su dolor de pecho necesita ser evaluado con Lilia Argue. Si desarrolla sntomas empeorados o falta de aire, debe llamar al 911 de inmediato. 3. Lo veremos nuevamente en 1 semana para asegurarnos de que est mejor.  Llame a la clnica al [phone] si sus sntomas empeoran o si tiene alguna inquietud. Fue Regulatory affairs officer.  Durward Parcel, DO [facility], PGY-3  Thank you for coming in to see [place] today. Please see below to review our plan for today's visit.  1.  I called in refills for your medications.  It is important that you take these as prescribed. 2.  Your chest pain needs to be evaluated urgently.  If you develop worsening symptoms or shortness of breath, you need to call 911 immediately. 3.  We will see you again in 1 week to make sure you are doing better.  Please call the clinic at [phone] if your symptoms worsen or you have any concerns. It was our pleasure to serve you.  Durward Parcel, DO [facility], PGY-3

## 2018-10-02 NOTE — Assessment & Plan Note (Signed)
Acute.  Concerning for unstable angina.  EKG NSR 106 bpm, QTc 456 with possible left atrial enlargement without block.  There is no signs of ST changes or T wave abnormalities.  PE less likely given Wells score 1.5.  More concerning for ACS, however despite multiple attempts to persuade patient to having immediate assessment in the ED, patient declined and would like to manage outpatient.  He is medically stable during evaluation and has good understanding of his risks. - Reviewed return precautions at length, RTC 2 days or sooner if needed with strict instructions on when to call 911 - Urgent ambulatory referral to cardiology - Checking TSH given history of palpitations

## 2018-10-02 NOTE — Progress Notes (Signed)
Subjective   Patient ID: Jeffrey Gray    DOB: 1961/07/22, 58 y.o. male   MRN: [id number]  CC: "I need a cardiology referral"  HPI: Jeffrey Gray is a 58 y.o. male who presents to clinic today for the following:  Chest pain: Patient reports intermittent chest pain with activity and at rest over the last week.  He does have a history of palpitations but not necessarily pain according to him.  He has been seen by cardiology in the past back in 2016 and was diagnosed with coronary vasospasm following evaluation and prior Myoview in 2013 with EF of 76% and no wall motion abnormalities.  He denies prior history of chest pain.  He has been poorly adherent to his antihypertensives and cholesterol medications.  He does not take a daily aspirin although this is listed in his medications.  He denies shortness of breath or diaphoresis associated with the chest pain which is left-sided and does not radiate with a soreness sensation.  He is a non-smoker but drinks on average 6-8 beers per week.  Hypertension: Has history of Lopressor and lisinopril-HCTZ use.  He is a known diabetic but reports poor adherence due to his medications due to his expiration of his prescriptions over the last 2 months.    Hyperlipidemia: Simvastatin currently listed on medication list, however patient reports not taking statins in over a year.  He was recently evaluated by his PCP 1 month ago with a normal CK after reporting generalized muscle aches primarily in the lower back and knees.  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  He has no previous history of MI or CVA.  Alcohol use disorder: Patient has longstanding history of this primarily with beer.  He currently drinks approximately 6-8 beers per day.  He has been instructed multiple times to decrease the amount but is amenable to decreasing today.  ROS: see HPI for pertinent.  PMFSH: Reviewed. Smoking status reviewed. Medications reviewed.  Objective   BP  (!) 160/99 (BP Location: Left Arm, Patient Position: Sitting, Cuff Size: Normal)   Pulse (!) 101   Temp 98.4 F (36.9 C) (Oral)   Wt 144 lb 12.8 oz (65.7 kg)   SpO2 96%   BMI 27.36 kg/m  Vitals and nursing note reviewed.  General: well nourished, well developed, NAD with non-toxic appearance HEENT: normocephalic, atraumatic, moist mucous membranes Neck: supple, non-tender without lymphadenopathy Cardiovascular: regular rate and rhythm without murmurs, rubs, or gallops, absent chest tenderness with gentle palpation of left chest wall Lungs: clear to auscultation bilaterally with normal work of breathing Abdomen: soft, non-tender, non-distended, normoactive bowel sounds Skin: warm, dry, no rashes or lesions, cap refill < 2 seconds Extremities: warm and well perfused, normal tone, no edema  Assessment & Plan   Primary hypertension Chronic.  Poor control due to nonadherence to antihypertensive therapy.  Non-smoker.  Likely exacerbated by alcohol use disorder. - Given refill for Lopressor 25 mg twice daily and lisinopril-HCTZ 20-12.5 mg twice daily - See plan for alcohol use disorder  Alcohol use disorder, moderate, dependence (HCC) Chronic.  Beer only, 6-8 daily.  Interested in cutting back.  Likely contributing to history of heart problems and poorly controlled blood pressure. - Discussed importance of slowly cutting back  Hyperlipidemia associated with type 2 diabetes mellitus (HCC) Chronic.  No recent statin use, although simvastatin is on his medication list.  No known statin induced myalgias.  Suspect chronic low back pain and knee pain is related to occupation.  Myoview in 2013 without obstruction coronaries, however new onset chest pain concerning for ACS. - See plan for chest pain - Initiating Lipitor 20 mg daily plans to increase if tolerated  Chest pain Acute.  Concerning for unstable angina.  EKG NSR 106 bpm, QTc 456 with possible left atrial enlargement without block.   There is no signs of ST changes or T wave abnormalities.  PE less likely given Wells score 1.5.  More concerning for ACS, however despite multiple attempts to persuade patient to having immediate assessment in the ED, patient declined and would like to manage outpatient.  He is medically stable during evaluation and has good understanding of his risks. - Reviewed return precautions at length, RTC 2 days or sooner if needed with strict instructions on when to call 911 - Urgent ambulatory referral to cardiology - Checking TSH given history of palpitations  Orders Placed This Encounter  Procedures  . TSH  . Ambulatory referral to Cardiology    Referral Priority:   Urgent    Referral Type:   Consultation    Referral Reason:   Specialty Services Required    Requested Specialty:   Cardiology    Number of Visits Requested:   1  . EKG 12-Lead  . EKG 12-Lead    Ordered by an unspecified provider   . EKG 12-Lead    Ordered by an unspecified provider    Meds ordered this encounter  Medications  . atorvastatin (LIPITOR) 20 MG tablet    Sig: Take 1 tablet (20 mg total) by mouth daily.    Dispense:  90 tablet    Refill:  3  . lisinopril-hydrochlorothiazide (PRINZIDE,ZESTORETIC) 20-12.5 MG tablet    Sig: Take 2 tablets by mouth daily. Please make an appointment for an office visit Thanks    Dispense:  180 tablet    Refill:  0    Please consider 90 day supplies to promote better adherence  . metoprolol tartrate (LOPRESSOR) 25 MG tablet    Sig: Take 1 tablet (25 mg total) by mouth 2 (two) times daily.    Dispense:  180 tablet    Refill:  0    Durward Parcel, DO [facility], PGY-3 10/02/2018, 2:47 PM

## 2018-10-02 NOTE — Assessment & Plan Note (Signed)
Chronic.  Beer only, 6-8 daily.  Interested in cutting back.  Likely contributing to history of heart problems and poorly controlled blood pressure. - Discussed importance of slowly cutting back

## 2018-10-03 ENCOUNTER — Other Ambulatory Visit: Payer: Self-pay

## 2018-10-03 ENCOUNTER — Emergency Department (HOSPITAL_COMMUNITY)
Admission: EM | Admit: 2018-10-03 | Discharge: 2018-10-03 | Disposition: A | Discharge status: Home / Self Care | Payer: Self-pay | Attending: Emergency Medicine | Admitting: Emergency Medicine

## 2018-10-03 ENCOUNTER — Encounter (HOSPITAL_COMMUNITY): Payer: Self-pay

## 2018-10-03 ENCOUNTER — Emergency Department (HOSPITAL_COMMUNITY): Payer: Self-pay

## 2018-10-03 DIAGNOSIS — Z7982 Long term (current) use of aspirin: Secondary | ICD-10-CM | POA: Insufficient documentation

## 2018-10-03 DIAGNOSIS — I2510 Atherosclerotic heart disease of native coronary artery without angina pectoris: Secondary | ICD-10-CM | POA: Insufficient documentation

## 2018-10-03 DIAGNOSIS — R079 Chest pain, unspecified: Secondary | ICD-10-CM | POA: Insufficient documentation

## 2018-10-03 DIAGNOSIS — E119 Type 2 diabetes mellitus without complications: Secondary | ICD-10-CM | POA: Insufficient documentation

## 2018-10-03 DIAGNOSIS — Z79899 Other long term (current) drug therapy: Secondary | ICD-10-CM | POA: Insufficient documentation

## 2018-10-03 DIAGNOSIS — I10 Essential (primary) hypertension: Secondary | ICD-10-CM | POA: Insufficient documentation

## 2018-10-03 DIAGNOSIS — Z7984 Long term (current) use of oral hypoglycemic drugs: Secondary | ICD-10-CM | POA: Insufficient documentation

## 2018-10-03 LAB — CBC
HCT: 43.4 % (ref 39.0–52.0)
Hemoglobin: 14.8 g/dL (ref 13.0–17.0)
MCH: 30.3 pg (ref 26.0–34.0)
MCHC: 34.1 g/dL (ref 30.0–36.0)
MCV: 88.8 fL (ref 80.0–100.0)
Platelets: 123 10*3/uL — ABNORMAL LOW (ref 150–400)
RBC: 4.89 MIL/uL (ref 4.22–5.81)
RDW: 13 % (ref 11.5–15.5)
WBC: 9.8 10*3/uL (ref 4.0–10.5)
nRBC: 0 % (ref 0.0–0.2)

## 2018-10-03 LAB — I-STAT TROPONIN, ED: Troponin i, poc: 0 ng/mL (ref 0.00–0.08)

## 2018-10-03 LAB — BASIC METABOLIC PANEL
Anion gap: 16 — ABNORMAL HIGH (ref 5–15)
BUN: 7 mg/dL (ref 6–20)
CO2: 23 mmol/L (ref 22–32)
Calcium: 8.4 mg/dL — ABNORMAL LOW (ref 8.9–10.3)
Chloride: 90 mmol/L — ABNORMAL LOW (ref 98–111)
Creatinine, Ser: 0.91 mg/dL (ref 0.61–1.24)
GFR calc Af Amer: >60 mL/min (ref >60)
GFR calc non Af Amer: >60 mL/min (ref >60)
Glucose, Bld: 180 mg/dL — ABNORMAL HIGH (ref 70–99)
Potassium: 3.2 mmol/L — ABNORMAL LOW (ref 3.5–5.1)
Sodium: 129 mmol/L — ABNORMAL LOW (ref 135–145)

## 2018-10-03 MED ORDER — POTASSIUM CHLORIDE CRYS ER 20 MEQ PO TBCR
60.0000 meq | EXTENDED_RELEASE_TABLET | Freq: Once | ORAL | Status: AC
  Administered 2018-10-03: 60 meq via ORAL
  Filled 2018-10-03: qty 3

## 2018-10-03 MED ORDER — SODIUM CHLORIDE 0.9% FLUSH
3.0000 mL | Freq: Once | INTRAVENOUS | Status: AC
  Administered 2018-10-03: 3 mL via INTRAVENOUS

## 2018-10-03 MED ORDER — MAGNESIUM OXIDE 400 (241.3 MG) MG PO TABS
800.0000 mg | ORAL_TABLET | Freq: Once | ORAL | Status: AC
  Administered 2018-10-03: 800 mg via ORAL
  Filled 2018-10-03: qty 2

## 2018-10-03 NOTE — ED Provider Notes (Signed)
[facility] EMERGENCY DEPARTMENT Provider Note   CSN: [id number] Arrival date & time: 10/03/18  2426    History   Chief Complaint Chief Complaint  Patient presents with  . Chest Pain    HPI Jeffrey Gray is a 58 y.o. male.     HPI   58 year old male with chest pain.  Language barrier.  Interpreter used.  Symptoms began about 4 to 5 days ago.  Central chest pressure.  Been pretty constant since onset.  No appreciable exacerbating relieving factors.  Denies any associated dyspnea, nausea, diaphoresis.  He has been having palpitations which he describes as his heart racing at times.  This does not necessarily correlate clearly with his chest discomfort.  No fevers or chills.  No cough.  No unusual leg pain or swelling.  No known cardiac history that he is aware of.  He does have a history of hypertension and diabetes.  He also drinks pretty heavily.  He estimates 8-10 beers per day.  Past Medical History:  Diagnosis Date  . Abdominal pain, right upper quadrant   . Abdominal pain, right upper quadrant 11/02/2014  . Alcohol abuse, unspecified   . Amputated finger    accidental, right index finger from machine  . Chest pain 12/27/2011  . Coronary atherosclerosis of native coronary artery   . Other and unspecified angina pectoris   . Other and unspecified hyperlipidemia   . Other chronic nonalcoholic liver disease   . Overweight(278.02)   . Pain in limb   . Proteinuria   . Type II or unspecified type diabetes mellitus without mention of complication, not stated as uncontrolled   . Unspecified essential hypertension     Patient Active Problem List   Diagnosis Date Noted  . Hyperlipidemia associated with type 2 diabetes mellitus (HCC) 10/02/2018  . Pain in joint, lower leg 08/14/2017  . Thoracic back pain 01/12/2017  . Elevated ALT measurement 12/18/2016  . Fatty infiltration of liver 11/06/2014  . Osgood-Schlatter's disease of right knee 09/05/2013  .  Acid reflux 07/08/2013  . Ganglion cyst 08/02/2012  . Anxiety 03/16/2012  . Chest pain 12/27/2011  . MICROALBUMINURIA 05/30/2010  . CORONARY ARTERY SPASM 05/24/2009  . CORONARY ATHEROSCLEROSIS, NATIVE VESSEL 05/24/2009  . Diabetes mellitus type 2 with complications (HCC) 04/05/2009  . Hyperlipidemia 04/05/2009  . Alcohol use disorder, moderate, dependence (HCC) 03/22/2009  . Primary hypertension 03/22/2009    Past Surgical History:  Procedure Laterality Date  . abdominal cardiac cath  9/10   mild CAD, vasospasm  . left leg fracture repair          Home Medications    Prior to Admission medications   Medication Sig Start Date End Date Taking? Authorizing Provider  aspirin (ASPIRIN LOW DOSE) 81 MG EC tablet Take 1 tablet (81 mg total) by mouth daily. 11/28/16   Haney, Arlyss Repress A, MD  atorvastatin (LIPITOR) 20 MG tablet Take 1 tablet (20 mg total) by mouth daily. 10/02/18   Wendee Beavers, DO  lisinopril-hydrochlorothiazide (PRINZIDE,ZESTORETIC) 20-12.5 MG tablet Take 2 tablets by mouth daily. Please make an appointment for an office visit Thanks 10/02/18   Wendee Beavers, DO  metFORMIN (GLUCOPHAGE) 500 MG tablet Take 1 tablet (500 mg total) by mouth daily with breakfast. 12/14/16   Uvaldo Rising, MD  metoprolol tartrate (LOPRESSOR) 25 MG tablet Take 1 tablet (25 mg total) by mouth 2 (two) times daily. 10/02/18   Wendee Beavers, DO  simvastatin (ZOCOR) 40 MG tablet  Take 1 tablet (40 mg total) by mouth at bedtime. 11/28/16   Bonney Aid, MD    Family History Family History  Problem Relation Age of Onset  . Hypertension Mother   . Liver disease Mother 62  . Heart attack Father 56  . Hypertension Father   . Diabetes Father   . Other Father        colon issues  . Heart attack Brother 54  . Diabetes Brother     Social History Social History   Tobacco Use  . Smoking status: Never Smoker  . Smokeless tobacco: Never Used  Substance Use Topics  . Alcohol use: Yes     Alcohol/week: 6.0 standard drinks    Types: 6 Cans of beer per week    Comment: former heavy drinker, has cut back  . Drug use: No     Allergies   Patient has no known allergies.   Review of Systems Review of Systems  All systems reviewed and negative, other than as noted in HPI.  Physical Exam Updated Vital Signs BP (!) 167/91   Pulse 85   Temp 97.6 F (36.4 C) (Oral)   Resp 10   SpO2 100%   Physical Exam Vitals signs and nursing note reviewed.  Constitutional:      General: He is not in acute distress.    Appearance: He is well-developed.  HENT:     Head: Normocephalic and atraumatic.  Eyes:     General:        Right eye: No discharge.        Left eye: No discharge.     Conjunctiva/sclera: Conjunctivae normal.  Neck:     Musculoskeletal: Neck supple.  Cardiovascular:     Rate and Rhythm: Normal rate and regular rhythm.     Heart sounds: Normal heart sounds. No murmur. No friction rub. No gallop.   Pulmonary:     Effort: Pulmonary effort is normal. No respiratory distress.     Breath sounds: Normal breath sounds.  Abdominal:     General: There is no distension.     Palpations: Abdomen is soft.     Tenderness: There is no abdominal tenderness.  Musculoskeletal:        General: No tenderness.     Comments: Lower extremities symmetric as compared to each other. No calf tenderness. Negative Homan's. No palpable cords.   Skin:    General: Skin is warm and dry.  Neurological:     Mental Status: He is alert.  Psychiatric:        Behavior: Behavior normal.        Thought Content: Thought content normal.      ED Treatments / Results  Labs (all labs ordered are listed, but only abnormal results are displayed) Labs Reviewed  CBC - Abnormal; Notable for the following components:      Result Value   Platelets 123 (*)    All other components within normal limits  BASIC METABOLIC PANEL  I-STAT TROPONIN, ED    EKG EKG  Interpretation  Date/Time:  Thursday October 03 2018 08:00:31 EST Ventricular Rate:  81 PR Interval:    QRS Duration: 88 QT Interval:  381 QTC Calculation: 443 R Axis:   51 Text Interpretation:  Sinus rhythm Baseline wander in lead(s) V1 Confirmed by Raeford Razor [phone]) on 10/03/2018 8:07:06 AM   Radiology No results found.   Dg Chest 2 View  Result Date: 10/03/2018 CLINICAL DATA:  Chest pain.  Hypertension.  EXAM: CHEST - 2 VIEW COMPARISON:  February 21, 2007 FINDINGS: No edema or consolidation. Heart upper normal in size with pulmonary vascularity normal. No adenopathy. There is degenerative change in the thoracic spine. IMPRESSION: No edema or consolidation.  Heart upper normal in size. Electronically Signed   By: Bretta Bang III M.D.   On: 10/03/2018 09:05    Procedures Procedures (including critical care time)  Medications Ordered in ED Medications  sodium chloride flush (NS) 0.9 % injection 3 mL (has no administration in time range)     Initial Impression / Assessment and Plan / ED Course  I have reviewed the triage vital signs and the nursing notes.  Pertinent labs & imaging results that were available during my care of the patient were reviewed by me and considered in my medical decision making (see chart for details).  58yM with CP. I doubt ACS, PE, dissection or other emergent process. Some of his symptoms may be related to etoh use/withdrawal. Advised to cut back considerably. Return precautions discussed. Outpt FU otherwise.   Final Clinical Impressions(s) / ED Diagnoses   Final diagnoses:  Chest pain, unspecified type    ED Discharge Orders    None       Raeford Razor, MD 10/09/18 [phone]

## 2018-10-03 NOTE — ED Notes (Signed)
Patient verbalizes understanding of discharge instructions. Opportunity for questioning and answers were provided. Armband removed by staff, pt discharged from ED.  

## 2018-10-03 NOTE — ED Triage Notes (Signed)
Pt presents for evaluation of chest pain and hypertension. Reports he drank a lot of beer yesterday, reports he drinks 8-10 beers/day.

## 2018-11-01 ENCOUNTER — Telehealth: Payer: Self-pay | Admitting: Cardiology

## 2018-11-01 NOTE — Telephone Encounter (Signed)
Daughter of pt called. She received a call from our office but no voicemail. Daughter would like to do a virtual appt if possible. Please leave a voicemail

## 2018-11-05 NOTE — Telephone Encounter (Signed)
Pt have telehealth visit with Dr Antoine Poche 04/02 @2 :00 pm

## 2018-11-05 NOTE — Telephone Encounter (Signed)
-----   Message from Rollene Rotunda, MD sent at 11/05/2018  1:35 PM EDT ----- Regarding: FW: new pt Please offer him a telehealth appt either Wed or Thursday.  If he does not want this please document.  ----- Message ----- From: Filbert Schilder, NP Sent: 10/25/2018  12:32 PM EDT To: Rollene Rotunda, MD Subject: new pt

## 2018-11-06 ENCOUNTER — Other Ambulatory Visit: Payer: Self-pay

## 2018-11-07 ENCOUNTER — Encounter: Payer: Self-pay | Admitting: Cardiology

## 2018-11-07 ENCOUNTER — Encounter

## 2018-11-07 ENCOUNTER — Telehealth (INDEPENDENT_AMBULATORY_CARE_PROVIDER_SITE_OTHER): Payer: Self-pay | Admitting: Cardiology

## 2018-11-08 ENCOUNTER — Encounter: Payer: Self-pay | Admitting: Cardiology

## 2018-11-08 ENCOUNTER — Telehealth (INDEPENDENT_AMBULATORY_CARE_PROVIDER_SITE_OTHER): Payer: Self-pay | Admitting: Cardiology

## 2018-11-08 ENCOUNTER — Ambulatory Visit: Payer: Self-pay | Admitting: Cardiology

## 2018-11-08 ENCOUNTER — Other Ambulatory Visit: Payer: Self-pay | Admitting: *Deleted

## 2018-11-08 DIAGNOSIS — R072 Precordial pain: Secondary | ICD-10-CM

## 2018-11-08 NOTE — Patient Instructions (Signed)
Medication Instructions:  Continue current medications  If you need a refill on your cardiac medications before your next appointment, please call your pharmacy.  Labwork: None ordered  Testing/Procedures: None Ordered   Follow-Up: . Your physician recommends that you schedule a follow-up appointment in: As Needed   At [facility], you and your health needs are our priority.  As part of our continuing mission to provide you with exceptional heart care, we have created designated Provider Care Teams.  These Care Teams include your primary Cardiologist (physician) and Advanced Practice Providers (APPs -  Physician Assistants and Nurse Practitioners) who all work together to provide you with the care you need, when you need it.  Thank you for choosing [facility] at [facility]!!     

## 2018-11-08 NOTE — Progress Notes (Signed)
Virtual Visit via Video Note    Evaluation Performed:  Follow-up visit  This visit type was conducted due to national recommendations for restrictions regarding the COVID-19 Pandemic (e.g. social distancing).  This format is felt to be most appropriate for this patient at this time.  All issues noted in this document were discussed and addressed.  No physical exam was performed (except for noted visual exam findings with Video Visits).  Please refer to the patient's chart (MyChart message for video visits and phone note for telephone visits) for the patient's consent to telehealth for [facility]  Date:  11/08/2018   ID:  Jeffrey Gray, DOB 1961-02-14, MRN [id number]  Patient Location:  Home address  Provider location:   [place] office  PCP:  Carney Living, MD  Cardiologist:  No primary care provider on file.  Electrophysiologist:  None   Chief Complaint:  Chest pain  History of Present Illness:    Jeffrey Gray is a 58 y.o. male who presents via audio/video conferencing for a telehealth visit today.    Patient has a past cardiac history with a cardiac catheterization in 2010 that was reported to demonstrate mild disease.  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  I do see that he had a stress test several years ago that was without evidence of ischemia or infarct.  This was in 2013.  He has not been seen in several years.  He does not speak much [place] and his daughter interpreted.  He was referred by his primary provider in the emergency room because of chest discomfort.  However, today he could not remember any of these details.  He could not qualify this will quantify this.  He is not having discomfort currently.  He is not having any shortness of breath.  I did review the primary care office records and the emergency room records.  He was felt to have atypical pain.  There was no objective evidence of ischemia on his EKG.  1 point-of-care marker was  normal.  The patient does not have symptoms concerning for COVID-19 infection (fever, chills, cough, or new shortness of breath).    Prior CV studies:   The following studies were reviewed today:  ED records. Carney Living, MD records  Past Medical History:  Diagnosis Date   Acid reflux 07/08/2013   Alcohol use disorder, moderate, dependence (HCC) 03/22/2009   Qualifier: Diagnosis of  By: Lafonda Mosses MD, Reche Dixon early Sept. 2012    Amputated finger    accidental, right index finger from machine   Anxiety 03/16/2012   Elevated ALT measurement 12/18/2016   Multiple labs 2017/2018 - suspect due to alcohol use/fatty liver   Elevated transaminase level 10/16/2011   Fatty infiltration of liver 11/06/2014   11/06/14 - fatty liver and hepatocellular disease on [place] (no evidence of mass/malignancy per radiologist)    Ganglion cyst 08/02/2012   Dorsum left foot     Hyperlipidemia 04/05/2009   Hyperlipidemia associated with type 2 diabetes mellitus (HCC) 10/02/2018   Osgood-Schlatter's disease of right knee 09/05/2013   Primary hypertension 03/22/2009   Qualifier: Diagnosis of  By: Lafonda Mosses MD, Darl Pikes     Proteinuria    Type II or unspecified type diabetes mellitus without mention of complication, not stated as uncontrolled    Unspecified essential hypertension    Past Surgical History:  Procedure Laterality Date   abdominal cardiac cath  9/10   mild CAD, vasospasm   left leg fracture repair  Current Meds  Medication Sig   atorvastatin (LIPITOR) 20 MG tablet Take 1 tablet (20 mg total) by mouth daily.   lisinopril-hydrochlorothiazide (PRINZIDE,ZESTORETIC) 20-12.5 MG tablet Take 2 tablets by mouth daily. Please make an appointment for an office visit Thanks   metoprolol tartrate (LOPRESSOR) 25 MG tablet Take 1 tablet (25 mg total) by mouth 2 (two) times daily.     Allergies:   Patient has no known allergies.   Social History   Tobacco Use   Smoking  status: Never Smoker   Smokeless tobacco: Never Used  Substance Use Topics   Alcohol use: Yes    Alcohol/week: 6.0 standard drinks    Types: 6 Cans of beer per week    Comment: former heavy drinker, has cut back   Drug use: No     Family Hx: The patient's family history includes Diabetes in his brother and father; Heart attack (age of onset: 29) in his brother; Heart attack (age of onset: 26) in his father; Hypertension in his father and mother; Liver disease (age of onset: 61) in his mother; Other in his father.  ROS:   Please see the history of present illness.    Anxiety All other systems reviewed and are negative.   Labs/Other Tests and Data Reviewed:    Recent Labs: 10/03/2018: BUN 7; Creatinine, Ser 0.91; Hemoglobin 14.8; Platelets 123; Potassium 3.2; Sodium 129   Recent Lipid Panel Lab Results  Component Value Date/Time   CHOL 153 08/14/2017 09:41 AM   TRIG 93 08/14/2017 09:41 AM   HDL 44 08/14/2017 09:41 AM   CHOLHDL 3.5 08/14/2017 09:41 AM   CHOLHDL 2.7 11/02/2014 12:40 PM   LDLCALC 90 08/14/2017 09:41 AM   LDLDIRECT 114 (H) 10/31/2011 08:43 AM    Wt Readings from Last 3 Encounters:  11/07/18 140 lb (63.5 kg)  10/02/18 144 lb 12.8 oz (65.7 kg)  08/14/17 137 lb 12.8 oz (62.5 kg)     Objective:    Vital Signs:  There were no vitals taken for this visit.   This was a video visit but the camera was not working   ASSESSMENT & PLAN:    CHEST PAIN:   Chest pain is atypical.  There is no objective evidence of ischemia.  He has had work-ups before.  Given the absence of any ongoing symptoms I do not think that further cardiovascular testing is suggested.  I had a long discussion with the patient through his daughter about this.  They will let us know if he has any recurrent symptoms.  HTN: Blood pressures not well controlled.  However, given the current situation I would not like to titrate his medications until we can follow him up personally.  I discussed this  with him and he will make a note that he needs to probably be started on another medication possibly amlodipine but he can track his blood pressure between now and the end of our quarantine.  COVID-19 Education: The signs and symptoms of COVID-19 were discussed with the patient and how to seek care for testing (follow up with PCP or arrange E-visit).  The importance of social distancing was discussed today.  I discussed this at length with the patient's daughter today.  They seem to be pretty well isolated although there are grandchildren going back and forth.  We discussed this.  Patient Risk:   After full review of this patient's clinical status, I feel that they are at least moderate risk at this time.  Time:  Today, I have spent 25  minutes with the patient with telehealth technology discussing the above.     Medication Adjustments/Labs and Tests Ordered: Current medicines are reviewed at length with the patient today.  Concerns regarding medicines are outlined above.  Tests Ordered: No orders of the defined types were placed in this encounter.  Medication Changes: No orders of the defined types were placed in this encounter.   Disposition:  Follow up prn  Signed, Rollene RotundaJames Tyeshia Cornforth, MD  11/08/2018 3:50 PM    [facility]

## 2018-11-14 NOTE — Progress Notes (Signed)
Error

## 2019-01-22 ENCOUNTER — Ambulatory Visit (INDEPENDENT_AMBULATORY_CARE_PROVIDER_SITE_OTHER): Payer: Self-pay | Admitting: Family Medicine

## 2019-01-22 ENCOUNTER — Other Ambulatory Visit: Payer: Self-pay

## 2019-01-22 VITALS — BP 145/80 | HR 82 | Wt 146.0 lb

## 2019-01-22 DIAGNOSIS — F1020 Alcohol dependence, uncomplicated: Secondary | ICD-10-CM

## 2019-01-22 DIAGNOSIS — I10 Essential (primary) hypertension: Secondary | ICD-10-CM

## 2019-01-22 DIAGNOSIS — E7800 Pure hypercholesterolemia, unspecified: Secondary | ICD-10-CM

## 2019-01-22 DIAGNOSIS — E785 Hyperlipidemia, unspecified: Secondary | ICD-10-CM

## 2019-01-22 DIAGNOSIS — E118 Type 2 diabetes mellitus with unspecified complications: Secondary | ICD-10-CM

## 2019-01-22 DIAGNOSIS — F419 Anxiety disorder, unspecified: Secondary | ICD-10-CM

## 2019-01-22 DIAGNOSIS — R740 Nonspecific elevation of levels of transaminase and lactic acid dehydrogenase [LDH]: Secondary | ICD-10-CM

## 2019-01-22 DIAGNOSIS — E1169 Type 2 diabetes mellitus with other specified complication: Secondary | ICD-10-CM

## 2019-01-22 DIAGNOSIS — R7401 Elevation of levels of liver transaminase levels: Secondary | ICD-10-CM

## 2019-01-22 DIAGNOSIS — R079 Chest pain, unspecified: Secondary | ICD-10-CM

## 2019-01-22 LAB — POCT GLYCOSYLATED HEMOGLOBIN (HGB A1C): HbA1c, POC (controlled diabetic range): 6.6 % (ref 0.0–7.0)

## 2019-01-22 NOTE — Assessment & Plan Note (Signed)
Check lipids today 

## 2019-01-22 NOTE — Assessment & Plan Note (Signed)
Handling being laid off and Covid well

## 2019-01-22 NOTE — Patient Instructions (Addendum)
Good to see you today!  Thanks for coming in.  I will review your records and your visit with the cardiologist to see if we need to do anything about your chest discomfort.  If it gets worse or bothers you please let us know  For your medicines take the at 8 PM every evening Take the metoprolol at 8 AM   Work on your weight aim to get back to around 138 by exercising and eating a little less  I will call you if your tests are not good.  Otherwise I will send you a letter.  If you do not hear from me with in 2 weeks please call our office.     See if you can afford an eye exam for your diabetes.  If so ask them to send me a report   Qu bueno verte hoy! Gracias por venir  Revisar sus registros y su visita con el cardilogo para ver si necesitamos hacer algo con respecto a su [employer]. Si empeora o le molesta, infrmenos  Para sus medicamentos, tmelos a las 8 PM todas [place] el metoprolol a las 8 a.m.  Trabaje en su objetivo de peso para volver a alrededor de 138 haciendo ejercicio y comiendo un poco menos  Te llamar si tus pruebas no son buenas. De lo contrario, te enviar [employer]. Si no tiene noticias mas en 2 semanas, llame a nuestra oficina.  Vea si puede pagar un examen de la vista para su diabetes. Si es as, pdales que me enven un informe.

## 2019-01-22 NOTE — Assessment & Plan Note (Signed)
Well controlled 

## 2019-01-22 NOTE — Assessment & Plan Note (Signed)
Will check today

## 2019-01-22 NOTE — Assessment & Plan Note (Signed)
Reports stopped for at least 3 months and now has 1-3 beers per week

## 2019-01-22 NOTE — Assessment & Plan Note (Signed)
He reports good control at home and has cut back on alcohol.  We discussed how to take his medications and asked to take at bedtime.  Will monitor .

## 2019-01-22 NOTE — Progress Notes (Signed)
Subjective  Jeffrey MooreJulian Gray is a 58 y.o. male is presenting with the following  ANXIETY Mild mostly at night or early morning.  He feels is under control and not interfering with his life  CHEST DISCOMFORT Sometimes has when exercising (walking) for a long time.  Does not make him stop.  Seems to have come on in the last several months but he is not really sure.  No shortness of breath  Had telemed visit with cardiology in April.  Last work up was EST in 2013   HYPERTENSION Disease Monitoring: Blood pressure range- in 120s/90s Chest pain, palpitations- see above      Dyspnea- no Medications: Compliance- brings in  Lightheadedness,Syncope- no   Edema- no  DIABETES Disease Monitoring: Blood Sugar ranges-usually in 100s Polyuria/phagia/dipsia- no      Visual problems- no Medications: Compliance- not on metformin for at least 3 months Hypoglycemic symptoms- no  HYPERLIPIDEMIA Disease Monitoring: See symptoms for Hypertension Medications: Compliance- sometimes forgets lipitor Right upper quadrant pain- no  Muscle aches- no  Monitoring Labs and Parameters Last A1C:  Lab Results  Component Value Date   HGBA1C 6.6 01/22/2019    Last Lipid:     Component Value Date/Time   CHOL 153 08/14/2017 0941   HDL 44 08/14/2017 0941   LDLDIRECT 114 (H) 10/31/2011 0843    Last Bmet  Potassium  Date Value Ref Range Status  10/03/2018 3.2 (L) 3.5 - 5.1 mmol/L Final    Comment:    SLIGHT HEMOLYSIS   Sodium  Date Value Ref Range Status  10/03/2018 129 (L) 135 - 145 mmol/L Final  08/14/2017 134 134 - 144 mmol/L Final   Creat  Date Value Ref Range Status  02/09/2015 0.60 0.50 - 1.35 mg/dL Final   Creatinine, Ser  Date Value Ref Range Status  10/03/2018 0.91 0.61 - 1.24 mg/dL Final      Last BPs:  BP Readings from Last 3 Encounters:  01/22/19 (!) 145/80  10/03/18 (!) 162/88  10/02/18 (!) 160/99     Chief Complaint noted Review of Symptoms - see HPI PMH - Smoking status  noted.    Objective Vital Signs reviewed BP (!) 145/80   Pulse 82   Wt 146 lb (66.2 kg)   SpO2 100%   BMI 27.59 kg/m  Mobility:able to get up and down from exam table without assistance or distress Heart - Regular rate and rhythm.  No murmurs, gallops or rubs.    Lungs:  Normal respiratory effort, chest expands symmetrically. Lungs are clear to auscultation, no crackles or wheezes. Abdomen: soft and non-tender without masses, organomegaly or hernias noted.  No guarding or rebound Extremities:  No cyanosis, edema, or deformity noted with good range of motion of all major joints.      Assessments/Plans  See after visit summary for details of patient instuctions  Alcohol use disorder, moderate, dependence (HCC) Reports stopped for at least 3 months and now has 1-3 beers per week   Anxiety Handling being laid off and Covid well   Chest pain Difficult to pinpoint how concerning this is.  Does not seem to be consistent with exercise.  After reviewing recent cardiology visit in April will send him a letter suggesting that if this is bothering him or if changing in any way to contact us or cardiology.  Would then likely need a stress test.  He is on good medical therapy   Diabetes mellitus type 2 with complications Well controlled   Elevated ALT  measurement Will check today   Hyperlipidemia associated with type 2 diabetes mellitus ([place]) Check lipids today   Primary hypertension He reports good control at home and has cut back on alcohol.  We discussed how to take his medications and asked to take at bedtime.  Will monitor .

## 2019-01-22 NOTE — Assessment & Plan Note (Signed)
Difficult to pinpoint how concerning this is.  Does not seem to be consistent with exercise.  After reviewing recent cardiology visit in April will send him a letter suggesting that if this is bothering him or if changing in any way to contact us or cardiology.  Would then likely need a stress test.  He is on good medical therapy

## 2019-01-23 LAB — CMP14+EGFR
ALT: 18 IU/L (ref 0–44)
AST: 21 IU/L (ref 0–40)
Albumin/Globulin Ratio: 1.2 (ref 1.2–2.2)
Albumin: 4.6 g/dL (ref 3.8–4.9)
Alkaline Phosphatase: 97 IU/L (ref 39–117)
BUN/Creatinine Ratio: 7 — ABNORMAL LOW (ref 9–20)
BUN: 6 mg/dL (ref 6–24)
Bilirubin Total: 0.8 mg/dL (ref 0.0–1.2)
CO2: 24 mmol/L (ref 20–29)
Calcium: 9.8 mg/dL (ref 8.7–10.2)
Chloride: 94 mmol/L — ABNORMAL LOW (ref 96–106)
Creatinine, Ser: 0.84 mg/dL (ref 0.76–1.27)
GFR calc Af Amer: 112 mL/min/{1.73_m2} (ref >59)
GFR calc non Af Amer: 97 mL/min/{1.73_m2} (ref >59)
Globulin, Total: 3.8 g/dL (ref 1.5–4.5)
Glucose: 146 mg/dL — ABNORMAL HIGH (ref 65–99)
Potassium: 4.1 mmol/L (ref 3.5–5.2)
Sodium: 136 mmol/L (ref 134–144)
Total Protein: 8.4 g/dL (ref 6.0–8.5)

## 2019-01-23 LAB — LIPID PANEL
Chol/HDL Ratio: 3.9 ratio (ref 0.0–5.0)
Cholesterol, Total: 213 mg/dL — ABNORMAL HIGH (ref 100–199)
HDL: 55 mg/dL (ref >39)
LDL Calculated: 133 mg/dL — ABNORMAL HIGH (ref 0–99)
Triglycerides: 126 mg/dL (ref 0–149)
VLDL Cholesterol Cal: 25 mg/dL (ref 5–40)

## 2019-01-24 ENCOUNTER — Encounter: Payer: Self-pay | Admitting: Family Medicine

## 2019-06-11 ENCOUNTER — Other Ambulatory Visit: Payer: Self-pay

## 2019-06-11 ENCOUNTER — Ambulatory Visit (INDEPENDENT_AMBULATORY_CARE_PROVIDER_SITE_OTHER): Payer: Self-pay | Admitting: Family Medicine

## 2019-06-11 ENCOUNTER — Encounter: Payer: Self-pay | Admitting: Family Medicine

## 2019-06-11 VITALS — BP 142/72 | HR 69 | Wt 143.8 lb

## 2019-06-11 DIAGNOSIS — E118 Type 2 diabetes mellitus with unspecified complications: Secondary | ICD-10-CM

## 2019-06-11 DIAGNOSIS — E871 Hypo-osmolality and hyponatremia: Secondary | ICD-10-CM | POA: Insufficient documentation

## 2019-06-11 DIAGNOSIS — E785 Hyperlipidemia, unspecified: Secondary | ICD-10-CM

## 2019-06-11 DIAGNOSIS — E1169 Type 2 diabetes mellitus with other specified complication: Secondary | ICD-10-CM

## 2019-06-11 DIAGNOSIS — F1020 Alcohol dependence, uncomplicated: Secondary | ICD-10-CM

## 2019-06-11 LAB — POCT GLYCOSYLATED HEMOGLOBIN (HGB A1C): HbA1c, POC (controlled diabetic range): 6.1 % (ref 0.0–7.0)

## 2019-06-11 NOTE — Assessment & Plan Note (Signed)
Good control

## 2019-06-11 NOTE — Progress Notes (Signed)
Subjective  Jeffrey Gray is a 58 y.o. male is presenting with the following  HYPONATREMIA Was admitted to hospital in [place] with Na 108 and acute DTs after stopping drinking beer for 2 days.  Was in ICU and discharged after one week.  Discharge Na was 129 according to his daughter.   He feels back to normal.  No lightheadness or dizzyness or edema or vomiting.  They changed his hctz/lisinopirl to lisinopril alone  ALCOHOL WITHDRAWAL Has not drank for 10 days.  No hallucinations or tremor or abdomen pain currently His has a counselor but does not have an appointment yet.  Plans to attend AA, Did once in past.  Living with his daughter who is with him today.     Chief Complaint noted Review of Symptoms - see HPI PMH - Smoking status noted.    Objective Vital Signs reviewed BP (!) 142/72   Pulse 69   Wt 143 lb 12.8 oz (65.2 kg)   SpO2 99%   BMI 27.17 kg/m  Psych:  Cognition and judgment appear intact. Alert, communicative  and cooperative with normal attention span and concentration. No apparent delusions, illusions, hallucinations Heart - Regular rate and rhythm.  No murmurs, gallops or rubs.    Lungs:  Normal respiratory effort, chest expands symmetrically. Lungs are clear to auscultation, no crackles or wheezes. Extremities:  No cyanosis, edema, or deformity noted with good range of motion of all major joints.   Abdomen: soft and non-tender without masses, organomegaly or hernias noted.  No guarding or rebound  Assessments/Plans  Alcohol use disorder, moderate, dependence (HCC) Worsened.   Discussed with patient and daughter.  The seem to understand importance of intensive treatment and have plans for AA and counseling.  Did not wish any further resources   Hyponatremia Seems improved by symptoms.  Will recheck   Diabetes mellitus type 2 with complications Good control   Hyperlipidemia associated with type 2 diabetes mellitus ([place]) Not taking statin now.  Will wait  till fully recovers from alcohol withdrawal before restarting    See after visit summary for details of patient instructions

## 2019-06-11 NOTE — Patient Instructions (Addendum)
Good to see you today!  Thanks for coming in  We will check his sodium and liver tests today.  I will call if they are bad other wise will send a message on My Chart or letter  Keep taking the two blood pressure medications but do not take the cholesterol Atorvastatin  Take the baby asa once a day  Work on not drinking with daily exercises - AA or counseling etc  If feel is about to drink the call [place] back in 3-4 weeks to check how blood pressure and sodium are doing  Call with any symptoms

## 2019-06-11 NOTE — Assessment & Plan Note (Signed)
Seems improved by symptoms.  Will recheck

## 2019-06-11 NOTE — Assessment & Plan Note (Signed)
Not taking statin now.  Will wait till fully recovers from alcohol withdrawal before restarting

## 2019-06-11 NOTE — Assessment & Plan Note (Signed)
Worsened.   Discussed with patient and daughter.  The seem to understand importance of intensive treatment and have plans for AA and counseling.  Did not wish any further resources

## 2019-06-12 ENCOUNTER — Encounter: Payer: Self-pay | Admitting: Family Medicine

## 2019-06-12 LAB — CMP14+EGFR
ALT: 66 IU/L — ABNORMAL HIGH (ref 0–44)
AST: 35 IU/L (ref 0–40)
Albumin/Globulin Ratio: 1.4 (ref 1.2–2.2)
Albumin: 4.4 g/dL (ref 3.8–4.9)
Alkaline Phosphatase: 84 IU/L (ref 39–117)
BUN/Creatinine Ratio: 16 (ref 9–20)
BUN: 11 mg/dL (ref 6–24)
Bilirubin Total: 0.2 mg/dL (ref 0.0–1.2)
CO2: 21 mmol/L (ref 20–29)
Calcium: 9.2 mg/dL (ref 8.7–10.2)
Chloride: 94 mmol/L — ABNORMAL LOW (ref 96–106)
Creatinine, Ser: 0.68 mg/dL — ABNORMAL LOW (ref 0.76–1.27)
GFR calc Af Amer: 123 mL/min/{1.73_m2} (ref >59)
GFR calc non Af Amer: 106 mL/min/{1.73_m2} (ref >59)
Globulin, Total: 3.1 g/dL (ref 1.5–4.5)
Glucose: 104 mg/dL — ABNORMAL HIGH (ref 65–99)
Potassium: 4.7 mmol/L (ref 3.5–5.2)
Sodium: 131 mmol/L — ABNORMAL LOW (ref 134–144)
Total Protein: 7.5 g/dL (ref 6.0–8.5)

## 2019-06-17 ENCOUNTER — Telehealth: Payer: Self-pay | Admitting: Family Medicine

## 2019-06-17 ENCOUNTER — Encounter: Payer: Self-pay | Admitting: Family Medicine

## 2019-06-17 NOTE — Telephone Encounter (Signed)
Patient daughter calling for her dads results from his last visit. Please call her back to discuss these results.

## 2019-07-23 ENCOUNTER — Ambulatory Visit (INDEPENDENT_AMBULATORY_CARE_PROVIDER_SITE_OTHER): Payer: Self-pay | Admitting: Family Medicine

## 2019-07-23 ENCOUNTER — Other Ambulatory Visit: Payer: Self-pay

## 2019-07-23 DIAGNOSIS — F1020 Alcohol dependence, uncomplicated: Secondary | ICD-10-CM

## 2019-07-23 DIAGNOSIS — E785 Hyperlipidemia, unspecified: Secondary | ICD-10-CM

## 2019-07-23 DIAGNOSIS — E871 Hypo-osmolality and hyponatremia: Secondary | ICD-10-CM

## 2019-07-23 DIAGNOSIS — R7401 Elevation of levels of liver transaminase levels: Secondary | ICD-10-CM

## 2019-07-23 DIAGNOSIS — E1169 Type 2 diabetes mellitus with other specified complication: Secondary | ICD-10-CM

## 2019-07-23 DIAGNOSIS — Z23 Encounter for immunization: Secondary | ICD-10-CM

## 2019-07-23 NOTE — Patient Instructions (Signed)
Good to see you today!  Thanks for coming in.  I will send you a my chart message about the labs and drinking water and restearting the cholesterol medication  Let me know if your blood pressure is high > 140/90 either one   Come back in 6 months

## 2019-07-23 NOTE — Assessment & Plan Note (Signed)
If Na is ok will restart statin

## 2019-07-23 NOTE — Assessment & Plan Note (Signed)
Recheck labs hopefully resolved now is abstinent by history

## 2019-07-23 NOTE — Assessment & Plan Note (Signed)
Hopefully is sober and can remain so

## 2019-07-23 NOTE — Progress Notes (Signed)
Subjective  Jeffrey Gray is a 58 y.o. male is presenting with the following  ALCOHOLISM Has not had a drink since last visit.   Has infrequent craving and then he drinks other liquids.  If got bad would talk to his daughter or wife.  Not going to counseling or AA  HYPONATREMIA Has been limiting his water intake but would like to liberalize.  Overall feels well without cramps etc  HYPERLIPIDEMIA Not on atorvastatin but would like to restart.  No RUQ pain   Chief Complaint noted Review of Symptoms - see HPI PMH - Smoking status noted.    Objective Vital Signs reviewed BP (!) 160/60   Pulse 61   Wt 147 lb (66.7 kg)   SpO2 100%   BMI 27.78 kg/m  Psych:  Cognition and judgment appear intact. Alert, communicative  and cooperative with normal attention span and concentration. No apparent delusions, illusions, hallucinations  Assessments/Plans  Hyponatremia Recheck labs   Hyperlipidemia associated with type 2 diabetes mellitus ([place]) If Na is ok will restart statin   Elevated ALT measurement Recheck labs hopefully resolved now is abstinent by history   Alcohol use disorder, moderate, dependence ([place]) Hopefully is sober and can remain so    See after visit summary for details of patient instructions

## 2019-07-23 NOTE — Assessment & Plan Note (Signed)
Recheck labs 

## 2019-07-24 ENCOUNTER — Encounter: Payer: Self-pay | Admitting: Family Medicine

## 2019-07-24 DIAGNOSIS — E1169 Type 2 diabetes mellitus with other specified complication: Secondary | ICD-10-CM

## 2019-07-24 DIAGNOSIS — E785 Hyperlipidemia, unspecified: Secondary | ICD-10-CM

## 2019-07-24 LAB — CMP14+EGFR
ALT: 23 IU/L (ref 0–44)
AST: 20 IU/L (ref 0–40)
Albumin/Globulin Ratio: 1.3 (ref 1.2–2.2)
Albumin: 4.4 g/dL (ref 3.8–4.9)
Alkaline Phosphatase: 102 IU/L (ref 39–117)
BUN/Creatinine Ratio: 11 (ref 9–20)
BUN: 8 mg/dL (ref 6–24)
Bilirubin Total: 0.3 mg/dL (ref 0.0–1.2)
CO2: 23 mmol/L (ref 20–29)
Calcium: 9.2 mg/dL (ref 8.7–10.2)
Chloride: 99 mmol/L (ref 96–106)
Creatinine, Ser: 0.75 mg/dL — ABNORMAL LOW (ref 0.76–1.27)
GFR calc Af Amer: 118 mL/min/{1.73_m2} (ref >59)
GFR calc non Af Amer: 102 mL/min/{1.73_m2} (ref >59)
Globulin, Total: 3.4 g/dL (ref 1.5–4.5)
Glucose: 114 mg/dL — ABNORMAL HIGH (ref 65–99)
Potassium: 4.1 mmol/L (ref 3.5–5.2)
Sodium: 137 mmol/L (ref 134–144)
Total Protein: 7.8 g/dL (ref 6.0–8.5)

## 2019-07-24 MED ORDER — ATORVASTATIN CALCIUM 20 MG PO TABS: 20.0000 mg | ORAL_TABLET | Freq: Every day | ORAL | 0 refills | Status: DC

## 2019-09-09 ENCOUNTER — Other Ambulatory Visit: Payer: Self-pay

## 2019-09-10 MED ORDER — LISINOPRIL 20 MG PO TABS: 20.0000 mg | ORAL_TABLET | Freq: Every day | ORAL | 2 refills | Status: DC

## 2019-09-12 MED ORDER — LISINOPRIL 20 MG PO TABS: 20.0000 mg | ORAL_TABLET | Freq: Every day | ORAL | 2 refills | Status: DC

## 2019-09-12 NOTE — Addendum Note (Signed)
Addended by: Veronda Prude on: 09/12/2019 03:18 PM   Modules accepted: Orders

## 2019-11-05 ENCOUNTER — Other Ambulatory Visit: Payer: Self-pay

## 2019-11-05 ENCOUNTER — Ambulatory Visit (INDEPENDENT_AMBULATORY_CARE_PROVIDER_SITE_OTHER): Payer: Self-pay | Admitting: Family Medicine

## 2019-11-05 DIAGNOSIS — E1169 Type 2 diabetes mellitus with other specified complication: Secondary | ICD-10-CM

## 2019-11-05 DIAGNOSIS — E785 Hyperlipidemia, unspecified: Secondary | ICD-10-CM

## 2019-11-05 DIAGNOSIS — N50811 Right testicular pain: Secondary | ICD-10-CM

## 2019-11-05 DIAGNOSIS — N50819 Testicular pain, unspecified: Secondary | ICD-10-CM | POA: Insufficient documentation

## 2019-11-05 LAB — POCT URINALYSIS DIP (MANUAL ENTRY)
Bilirubin, UA: NEGATIVE
Blood, UA: NEGATIVE
Glucose, UA: NEGATIVE mg/dL
Ketones, POC UA: NEGATIVE mg/dL
Leukocytes, UA: NEGATIVE
Nitrite, UA: NEGATIVE
Protein Ur, POC: NEGATIVE mg/dL
Spec Grav, UA: 1.02 (ref 1.010–1.025)
Urobilinogen, UA: 0.2 E.U./dL
pH, UA: 6 (ref 5.0–8.0)

## 2019-11-05 MED ORDER — SULFAMETHOXAZOLE-TRIMETHOPRIM 800-160 MG PO TABS: 1.0000 | ORAL_TABLET | Freq: Two times a day (BID) | ORAL | 0 refills | Status: DC

## 2019-11-05 NOTE — Assessment & Plan Note (Signed)
Presentation most consistent with epididymitis.   No signs of torsion or hernia.  Given age and lack of sex exposure will treat with bactrim.

## 2019-11-05 NOTE — Progress Notes (Signed)
    SUBJECTIVE:   CHIEF COMPLAINT / HPI:   R TESTICULAR PAIN Having pain in his R testicle and R groin for about 4-5 days.  No dysuria or hematuria or discharge or mass in his groin.  Pain comes and goes sometimes down R side but always worse in the testicle.  No trauma or new sex partner  ETOH No drink for 5 months!  CHOLESTEROL Taking lipitor daily   PERTINENT  PMH / PSH: History of etoh abuse  OBJECTIVE:   BP 130/80   Pulse 60   Wt 149 lb 6.4 oz (67.8 kg)   SpO2 99%   BMI 28.23 kg/m   R testicle is mildly tender as best can tell most tender at the epdidymus.  No mass or testicle or hernia detected with straining Abdomen: soft and non-tender without masses, organomegaly or hernias noted.  No guarding or rebound No change in pain with back range of motion which is full. Mobility:able to get up and down from exam table without assistance or distress No CVAT No groin rash  UA - normal  ASSESSMENT/PLAN:   No problem-specific Assessment & Plan notes found for this encounter.     Carney Living, MD [facility]

## 2019-11-05 NOTE — Patient Instructions (Addendum)
Good to see you today!  Thanks for coming in.  I think you have an infection in your testicle  Take the antibiotic every 12 hours for 10 days  If you are getting worse or are having fever or severe pain or blood in your urine then call us  Come back in 2-3 weeks for a recheck  We will check your cholesterol today also

## 2019-11-06 LAB — LDL CHOLESTEROL, DIRECT: LDL Direct: 52 mg/dL (ref 0–99)

## 2019-11-10 ENCOUNTER — Other Ambulatory Visit: Payer: Self-pay

## 2019-11-10 DIAGNOSIS — E785 Hyperlipidemia, unspecified: Secondary | ICD-10-CM

## 2019-11-10 DIAGNOSIS — E1169 Type 2 diabetes mellitus with other specified complication: Secondary | ICD-10-CM

## 2019-11-10 MED ORDER — ATORVASTATIN CALCIUM 20 MG PO TABS: 20.0000 mg | ORAL_TABLET | Freq: Every day | ORAL | 2 refills | Status: DC

## 2019-11-12 ENCOUNTER — Other Ambulatory Visit: Payer: Self-pay | Admitting: Family Medicine

## 2019-11-12 DIAGNOSIS — I10 Essential (primary) hypertension: Secondary | ICD-10-CM

## 2019-11-12 MED ORDER — METOPROLOL TARTRATE 25 MG PO TABS: 25.0000 mg | ORAL_TABLET | Freq: Two times a day (BID) | ORAL | 2 refills | Status: DC

## 2019-11-19 ENCOUNTER — Encounter: Payer: Self-pay | Admitting: Family Medicine

## 2019-11-19 ENCOUNTER — Other Ambulatory Visit: Payer: Self-pay

## 2019-11-19 ENCOUNTER — Ambulatory Visit (INDEPENDENT_AMBULATORY_CARE_PROVIDER_SITE_OTHER): Payer: Self-pay | Admitting: Family Medicine

## 2019-11-19 VITALS — BP 130/80 | HR 82 | Ht 61.0 in | Wt 151.4 lb

## 2019-11-19 DIAGNOSIS — E118 Type 2 diabetes mellitus with unspecified complications: Secondary | ICD-10-CM

## 2019-11-19 DIAGNOSIS — R1031 Right lower quadrant pain: Secondary | ICD-10-CM

## 2019-11-19 DIAGNOSIS — E1169 Type 2 diabetes mellitus with other specified complication: Secondary | ICD-10-CM

## 2019-11-19 DIAGNOSIS — E785 Hyperlipidemia, unspecified: Secondary | ICD-10-CM

## 2019-11-19 DIAGNOSIS — N50811 Right testicular pain: Secondary | ICD-10-CM

## 2019-11-19 LAB — POCT GLYCOSYLATED HEMOGLOBIN (HGB A1C): HbA1c, POC (controlled diabetic range): 6.8 % (ref 0.0–7.0)

## 2019-11-19 MED ORDER — ATORVASTATIN CALCIUM 20 MG PO TABS: 20.0000 mg | ORAL_TABLET | Freq: Every day | ORAL | 2 refills | Status: DC

## 2019-11-19 NOTE — Progress Notes (Signed)
    SUBJECTIVE:   CHIEF COMPLAINT / HPI:   GROIN PAIN Has intermittent mild pain in R inguinal crease and sometimes radiates from back.  His testicle pain has resolved.  No pain with urination or blood in urine.  Not bad enough to take anything  DIABETES Disease Monitoring: Blood Sugar range-not checking   Medications Adherence diet Hypoglycemic symptoms- no      PERTINENT  PMH / PSH: [place] - not drinking  OBJECTIVE:   BP 130/80   Pulse 82   Ht 5\' 1"  (1.549 m)   Wt 151 lb 6.4 oz (68.7 kg)   SpO2 98%   BMI 28.61 kg/m   Abdomen - very mild tenderness with deep palpation in RLQ.  No guarding or rebound or masses.  No CVAT.   No pain with SLR or hip ROM   ASSESSMENT/PLAN:   Testicular pain resolved  Groin pain Is very mild currently.  Will observe since does not seem to be intrabdominal or renal.  Likely musculoskeletal  Strain      , MD [facility]

## 2019-11-19 NOTE — Patient Instructions (Signed)
Good to see you today!  Thanks for coming in.  You should watch your weight and decrease sweet and fatty foods.  Take tylenol for the pain in your side   If the pain becomes severe or any bleeding or any digestive symptoms then call me  Come back 3 months for diabetes and weight check

## 2019-11-20 DIAGNOSIS — R1030 Lower abdominal pain, unspecified: Secondary | ICD-10-CM | POA: Insufficient documentation

## 2019-11-20 NOTE — Assessment & Plan Note (Signed)
Is very mild currently.  Will observe since does not seem to be intrabdominal or renal.  Likely musculoskeletal  Strain

## 2019-11-20 NOTE — Assessment & Plan Note (Signed)
resolved 

## 2020-02-13 ENCOUNTER — Telehealth: Payer: Self-pay | Admitting: *Deleted

## 2020-02-13 ENCOUNTER — Encounter: Payer: Self-pay | Admitting: Family Medicine

## 2020-02-13 ENCOUNTER — Other Ambulatory Visit: Payer: Self-pay

## 2020-02-13 ENCOUNTER — Ambulatory Visit (INDEPENDENT_AMBULATORY_CARE_PROVIDER_SITE_OTHER): Payer: Self-pay | Admitting: Family Medicine

## 2020-02-13 VITALS — BP 150/80 | HR 84 | Ht 61.0 in | Wt 151.4 lb

## 2020-02-13 DIAGNOSIS — M79641 Pain in right hand: Secondary | ICD-10-CM

## 2020-02-13 DIAGNOSIS — N50811 Right testicular pain: Secondary | ICD-10-CM

## 2020-02-13 DIAGNOSIS — E118 Type 2 diabetes mellitus with unspecified complications: Secondary | ICD-10-CM

## 2020-02-13 DIAGNOSIS — M79642 Pain in left hand: Secondary | ICD-10-CM

## 2020-02-13 DIAGNOSIS — F1020 Alcohol dependence, uncomplicated: Secondary | ICD-10-CM

## 2020-02-13 LAB — POCT GLYCOSYLATED HEMOGLOBIN (HGB A1C): HbA1c, POC (controlled diabetic range): 6.8 % (ref 0.0–7.0)

## 2020-02-13 LAB — POCT URINALYSIS DIP (CLINITEK)
Bilirubin, UA: NEGATIVE
Blood, UA: NEGATIVE
Glucose, UA: NEGATIVE mg/dL
Ketones, POC UA: NEGATIVE mg/dL
Leukocytes, UA: NEGATIVE
Nitrite, UA: NEGATIVE
POC PROTEIN,UA: NEGATIVE
Spec Grav, UA: 1.015 (ref 1.010–1.025)
Urobilinogen, UA: 0.2 E.U./dL
pH, UA: 7 (ref 5.0–8.0)

## 2020-02-13 MED ORDER — SULFAMETHOXAZOLE-TRIMETHOPRIM 800-160 MG PO TABS: 1.0000 | ORAL_TABLET | Freq: Two times a day (BID) | ORAL | 0 refills | Status: DC

## 2020-02-13 NOTE — Assessment & Plan Note (Addendum)
Tenderness to palpation only on testicle, with some radiating pains along the inguinal canal.  No physical abnormality of the testicle was appreciated.  No swelling or warmth appreciated.  No inguinal hernia palpable.  Urinalysis was negative.  Advised watchful waiting with Bactrim prescription available at pharmacy if it acutely worsens. Unclear etiology, potential early mild epididymitis. No evidence of significant infection (rubor, dulor, calor). No evidence of stone given no hematuria. No evidence of hernia on physical exam.

## 2020-02-13 NOTE — Assessment & Plan Note (Addendum)
Hgb A1c today 6.8, same as last check in March.  We will follow up in 3 months at next check.

## 2020-02-13 NOTE — Progress Notes (Signed)
SUBJECTIVE:   CHIEF COMPLAINT / HPI:   Jeffrey Gray is Spanish-speaking and we used a video based interpreter for the duration of this visit.  Right Testicular pain 2 to 3-day history of right testicular pain, tenderness to palpation.  Some radiating pain up the right inguinal canal. No fever or chills.  No appreciable swelling or warmth that he has noticed.  Still walking normally, no increased clumsiness of the lower extremities present.  No changes in sensation in lower extremities or saddle area.  No urinary or fecal incontinence.  Bowel movements and urination normal.  No CVA tenderness, dysuria, or urinary urgency.  He has experienced an episode of this before, and was treated by Dr. Deirdre Gray as potential epididymitis.  He achieved resolution of symptoms after a course of Bactrim.  Bilateral hand pain Both of his hands are mild to moderately achy upon waking, but improved with a little bit of movement and time.  No family history of arthritis.  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  He has not noticed any decreased function or abnormal sensation of his hands.  No red or swollen finger joints.  Diabetes mellitus type 2 Since he was here today for testicular pain, we took the opportunity to test his A1c.   PERTINENT  PMH / PSH:  Right testicular pain - previous episode of testicular pain a couple months ago.  Treated as potential epididymitis with Bactrim.  Bilateral hand pain - no family history of arthritis, rheumatoid or osteo-.  Works as a Copy.   OBJECTIVE:   BP (!) 150/80 Comment: provider informed  Pulse 84   Ht 5\' 1"  (1.549 m)   Wt 151 lb 6 oz (68.7 kg)   SpO2 99%   BMI 28.60 kg/m    Physical Exam Vitals and nursing note reviewed. Exam conducted with a chaperone present.  Constitutional:      General: He is not in acute distress.    Appearance: Normal appearance. He is not ill-appearing.  HENT:     Head: Normocephalic.   Abdominal:     Hernia: There is no hernia in the right inguinal area.  Genitourinary:    Pubic Area: No rash.      Penis: Normal and uncircumcised.      Testes:        Right: Mass, tenderness or swelling not present.     Epididymis:     Right: Not inflamed or enlarged. Tenderness (Mild TTP) present. No mass.     Left: Normal.  Lymphadenopathy:     Lower Body: No right inguinal adenopathy.  Skin:    General: Skin is warm and dry.  Neurological:     Mental Status: He is alert and oriented to person, place, and time.  Psychiatric:        Mood and Affect: Mood normal.        Behavior: Behavior normal.   Hands: No significant deformity. No redness, swelling, rashes. Full ROM.    ASSESSMENT/PLAN:   Diabetes mellitus type 2 with complications Hgb A1c today 6.8, same as last check in March.  We will follow up in 3 months at next check.  Alcohol use disorder, moderate, dependence (HCC) Mr. Jeffrey Gray today reports 9 months of sobriety.  He occasionally enjoys nonalcoholic beer.  Bilateral hand pain Mr. Jeffrey Gray today reports mild to moderate bilateral achy hand pain in the morning, and resolves after some movement within 30 minutes.  Intact  range of motion, no swollen or red joints.  No changes in sensation.  No radiating pain.  We suggested using warmth to ease his pain, either by warm water or a warm pack.  We also suggested Tylenol if he needed a little extra pain relief.  Testicular pain Tenderness to palpation only on testicle, with some radiating pains along the inguinal canal.  No physical abnormality of the testicle was appreciated.  No swelling or warmth appreciated.  No inguinal hernia palpable.  Urinalysis was negative.  Advised watchful waiting with Bactrim prescription available at pharmacy if it acutely worsens. Unclear etiology, potential early mild epididymitis. No evidence of significant infection (rubor, dulor, calor). No evidence of stone given no hematuria. No evidence  of hernia on physical exam.      Jeffrey Pho, MD [facility]

## 2020-02-13 NOTE — Telephone Encounter (Signed)
Pts daughter called because father is experiencing pain in his right testicle again, denies fever.  Spoke with Dr. Deirdre Priest who will call patient to determine next steps.  You can reach him @ [phone], but he will need an interpretor per daughter [name].  Jone Baseman, CMA

## 2020-02-13 NOTE — Assessment & Plan Note (Addendum)
Jeffrey Gray today reports mild to moderate bilateral achy hand pain in the morning, and resolves after some movement within 30 minutes.  Intact range of motion, no swollen or red joints.  No changes in sensation.  No radiating pain.  We suggested using warmth to ease his pain, either by warm water or a warm pack.  We also suggested Tylenol if he needed a little extra pain relief.

## 2020-02-13 NOTE — Telephone Encounter (Signed)
Dr. Larita Fife has appt @ 2:45 this afternoon (working with [employer]).  Spoke with [name], her dad gets off @ 2 and should be able to make appt.  She will call him now and call back if this is not possible. Jone Baseman, CMA

## 2020-02-13 NOTE — Assessment & Plan Note (Signed)
Mr. Jeffrey Gray today reports 9 months of sobriety.  He occasionally enjoys nonalcoholic beer.

## 2020-02-13 NOTE — Patient Instructions (Addendum)
Thank you for coming in today, it was very nice to meet you.  Below is the summary of what we talked about today.  Testicular pain Because you are doing very well and there is low concern for infection of your testicle, we have elected to wait and see.  We will have the prescription antibiotic at the pharmacy for you if you need to pick it up.  However, we encourage you to give it 3 to 5 days and see if his pain to palpation goes away its own.  If your testicle becomes more swollen, red, hot, or you feel as though you have fever and chills please pick up the antibiotic.  Please not hesitate to follow-up and contact us with any questions or concerns you may have.  Morning pain in both hands  The morning pain and achiness he described in your hands sounds like arthritis.  To ease joint stiffness in the morning, if you need a little extra help you could warm your hands with warm water or a heat pack.  If you feel as though your hands are very achy and you need a little extra relief, they are welcome to try an over-the-counter Tylenol dose.  Again, please not hesitate to follow-up with any questions or concerns he may have.   Diabetes Your blood sugar today was within our target range.  Your diabetes control is doing very well.  Please follow-up in 3 months for [place] to recheck.  Hypertension Blood pressure today was up a little bit, it was 150/80.  We will recheck this when you come back next time.    Vonita Moss, MD PGY-1, [facility]  Gracias por venir hoy, fue un placer conocerte. A continuacin se muestra el resumen de lo que hablamos hoy.  Dolor testicular Debido a que le est yendo muy bien y hay poca preocupacin por la infeccin de su testculo, hemos optado por esperar y ver. Tendremos el antibitico recetado en la farmacia si necesita recogerlo. Sin embargo, Engineer, building services d de 3 a 5 das y vea Market researcher a la palpacin desaparece por s solo. Si su testculo se  hincha ms, se enrojece, se calienta o siente que tiene fiebre y escalofros, tome el antibitico. No dude en hacer un seguimiento y contactarnos con cualquier pregunta o inquietud que pueda tener.  Dolor matutino en ambas manos El dolor matutino y el dolor que describi en sus manos suenan a artritis. Para aliviar la rigidez de las articulaciones por la maana, si necesita un poco de [place], puede calentar sus manos con agua tibia o una compresa caliente. Si siente que le duelen [employer] y Transport planner un poco de alivio [place], puede probar una dosis de Tylenol de Sales promotion account executive. Nuevamente, no dude en hacer un seguimiento con cualquier pregunta o inquietud que pueda tener.  Diabetes Su nivel de azcar en sangre hoy estuvo dentro de nuestro rango objetivo. Su control de la diabetes est funcionando muy bien. Haga un seguimiento en 3 meses para que lo volvamos a verificar.  Hipertensin La presin arterial de hoy subi un poco, era 150/80. Volveremos a verificar esto cuando regrese la prxima vez.    Vonita Moss, MD PGY-1, [facility]

## 2020-06-10 ENCOUNTER — Other Ambulatory Visit: Payer: Self-pay | Admitting: Family Medicine

## 2020-06-16 ENCOUNTER — Ambulatory Visit (INDEPENDENT_AMBULATORY_CARE_PROVIDER_SITE_OTHER): Payer: Self-pay | Admitting: Family Medicine

## 2020-06-16 ENCOUNTER — Encounter: Payer: Self-pay | Admitting: Family Medicine

## 2020-06-16 ENCOUNTER — Other Ambulatory Visit: Payer: Self-pay

## 2020-06-16 VITALS — BP 158/86 | HR 90 | Wt 154.2 lb

## 2020-06-16 DIAGNOSIS — I10 Essential (primary) hypertension: Secondary | ICD-10-CM

## 2020-06-16 DIAGNOSIS — S8002XA Contusion of left knee, initial encounter: Secondary | ICD-10-CM

## 2020-06-16 DIAGNOSIS — E118 Type 2 diabetes mellitus with unspecified complications: Secondary | ICD-10-CM

## 2020-06-16 LAB — POCT GLYCOSYLATED HEMOGLOBIN (HGB A1C): Hemoglobin A1C: 7.8 % — AB (ref 4.0–5.6)

## 2020-06-16 NOTE — Patient Instructions (Addendum)
You have injured the muscle of your left thigh.  It will heal on its own but it may take 4-6 weeks. I am concerned that your blood pressure is high.  Maybe it is due to being out of medicine for a few days.  Please check it at home.  We are looking for readings of 140/90 or less. Your A1C is high.  Perhaps that is related to the 10 lbs you have gained.  Work hard on diet and exercise.  See Dr. Deirdre Priest in 3 months.  If it is still high, he will need to start a medicine for diabetes.

## 2020-06-17 ENCOUNTER — Encounter: Payer: Self-pay | Admitting: Family Medicine

## 2020-06-17 DIAGNOSIS — S8002XA Contusion of left knee, initial encounter: Secondary | ICD-10-CM | POA: Insufficient documentation

## 2020-06-17 NOTE — Assessment & Plan Note (Signed)
Suboptimal control.  Focus on diet and exercise.  FU three months with PCP.  If still elevated, start metformin.

## 2020-06-17 NOTE — Assessment & Plan Note (Signed)
Exam consistent with a muscular injury.  I see no reason for x rays.  Should heal without problem.

## 2020-06-17 NOTE — Progress Notes (Signed)
    SUBJECTIVE:   CHIEF COMPLAINT / HPI:   Left knee pain. Patient fell on left knee ~ 2 weeks ago.  Fell directly ahead without any twisting or side motion.  Struck upper ant part of knee.  Continued walking.  Sought care now because soreness has not improved after two weeks.   Financial.  Uninsured.  Pays out of pocket. DM  A1C has climbed from 6.8 to 7.8.  During the same timeframe, his weight is up 10 lbs.  Currently not on any meds for diabetes. HBP, Elevated today.  States he is taking his lisinopril and metoprolol.  No CP or SOB.   Cholesterol: Diabetic on statin.    OBJECTIVE:   BP (!) 158/86   Pulse 90   Wt 154 lb 3.2 oz (69.9 kg)   SpO2 99%   BMI 29.14 kg/m   Lungs clear Cardiac RRR without m or g Left knee.  Points to low, medial quadracep near its insertion onto the patella.  No effusion.  No bony tenderness over patella, femoral condyles or tibial plateau.  ASSESSMENT/PLAN:   No problem-specific Assessment & Plan notes found for this encounter.     Moses Manners, MD [facility]

## 2020-06-17 NOTE — Assessment & Plan Note (Signed)
Elevated BP today.  Check home BPs and FU with PCP.

## 2020-09-16 ENCOUNTER — Other Ambulatory Visit: Payer: Self-pay | Admitting: Family Medicine

## 2020-09-28 ENCOUNTER — Other Ambulatory Visit: Payer: Self-pay | Admitting: Family Medicine

## 2020-09-28 DIAGNOSIS — I10 Essential (primary) hypertension: Secondary | ICD-10-CM

## 2020-10-16 ENCOUNTER — Other Ambulatory Visit: Payer: Self-pay | Admitting: Family Medicine

## 2020-11-02 ENCOUNTER — Other Ambulatory Visit: Payer: Self-pay | Admitting: Family Medicine

## 2020-11-10 NOTE — Progress Notes (Signed)
Cardiology Office Note   Date:  11/12/2020   ID:  Jeffrey Gray, DOB [birth year], MRN [id number]  PCP:  Carney Living, MD  Cardiologist:   Rollene Rotunda, MD  Chief Complaint  Patient presents with  . Chest Pain      History of Present Illness: Jeffrey Gray is a 60 y.o. male who presents for follow up of chest pain.  The patient has a past cardiac history with a cardiac catheterization in 2010 that was reported to demonstrate mild disease.  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  I do see that he had a stress test several years ago that was without evidence of ischemia or infarct.  This was in 2013.    I saw him remotely at the last visit.    Since I last saw him he has done well.  He works in [employer] which she probably.  This might make him a little breathless but this does not sound like its progressed.  He has occasional stinging discomfort and palpitations but these are nonsustained.  He has no presyncope or syncope.  He does not have any resting shortness of breath, PND or orthopnea.  He has had no chest pressure or neck discomfort.  I do see that he has an elevated A1c and he thought he was treating this with diet but it is actually gone up and he failed to tell his wife that he was supposed to have follow-up on this.  He did not arrange follow-up with his primary provider to start therapy.  Note I used an interpreter throughout this.   Past Medical History:  Diagnosis Date  . Acid reflux 07/08/2013  . Alcohol use disorder, moderate, dependence (HCC) 03/22/2009   Qualifier: Diagnosis of  By: Lafonda Mosses MD, Reche Dixon early Sept. 2012   . Amputated finger    accidental, right index finger from machine  . Anxiety 03/16/2012  . Elevated ALT measurement 12/18/2016   Multiple labs 2017/2018 - suspect due to alcohol use/fatty liver  . Elevated transaminase level 10/16/2011  . Fatty infiltration of liver 11/06/2014   11/06/14 - fatty liver and  hepatocellular disease on [place] (no evidence of mass/malignancy per radiologist)   . Ganglion cyst 08/02/2012   Dorsum left foot    . Hyperlipidemia 04/05/2009  . Hyperlipidemia associated with type 2 diabetes mellitus (HCC) 10/02/2018  . Osgood-Schlatter's disease of right knee 09/05/2013  . Primary hypertension 03/22/2009   Qualifier: Diagnosis of  By: Lafonda Mosses MD, Darl Pikes    . Proteinuria   . Type II or unspecified type diabetes mellitus without mention of complication, not stated as uncontrolled   . Unspecified essential hypertension     Past Surgical History:  Procedure Laterality Date  . abdominal cardiac cath  9/10   mild CAD, vasospasm  . left leg fracture repair       Current Outpatient Medications  Medication Sig Dispense Refill  . aspirin (ASPIRIN LOW DOSE) 81 MG EC tablet Take 1 tablet (81 mg total) by mouth daily. 90 tablet 3  . atorvastatin (LIPITOR) 20 MG tablet Take 1 tablet (20 mg total) by mouth daily. 90 tablet 2  . lisinopril (ZESTRIL) 40 MG tablet Take 1 tablet (40 mg total) by mouth daily. 90 tablet 3  . metoprolol tartrate (LOPRESSOR) 25 MG tablet Take 1 tablet by mouth twice daily 180 tablet 1   No current facility-administered medications for this visit.    Allergies:   Patient has no known  allergies.   ROS:  Please see the history of present illness.   Otherwise, review of systems are positive for none.   All other systems are reviewed and negative.    PHYSICAL EXAM: VS:  BP (!) 170/88 (BP Location: Left Arm, Patient Position: Sitting, Cuff Size: Normal)   Pulse 60   Wt 151 lb 9.6 oz (68.8 kg)   SpO2 98%   BMI 28.64 kg/m  , BMI Body mass index is 28.64 kg/m. GENERAL:  Well appearing HEENT:  Pupils equal round and reactive, fundi not visualized, oral mucosa unremarkable NECK:  No jugular venous distention, waveform within normal limits, carotid upstroke brisk and symmetric, no bruits, no thyromegaly LYMPHATICS:  No cervical, inguinal adenopathy LUNGS:   Clear to auscultation bilaterally BACK:  No CVA tenderness CHEST:  Unremarkable HEART:  PMI not displaced or sustained,S1 and S2 within normal limits, no S3, no S4, no clicks, no rubs, no murmurs ABD:  Flat, positive bowel sounds normal in frequency in pitch, no bruits, no rebound, no guarding, no midline pulsatile mass, no hepatomegaly, no splenomegaly EXT:  2 plus pulses throughout, no edema, no cyanosis no clubbing SKIN:  No rashes no nodules NEURO:  Cranial nerves II through XII grossly intact, motor grossly intact throughout PSYCH:  Cognitively intact, oriented to person place and time    EKG:  EKG is ordered today. The ekg ordered today demonstrates sinus rhythm, rate 60, axis within normal limits, intervals within normal limits, no acute ST-T wave changes.   Recent Labs: No results found for requested labs within last 8760 hours.    Lipid Panel    Component Value Date/Time   CHOL 213 (H) 01/22/2019 0950   TRIG 126 01/22/2019 0950   HDL 55 01/22/2019 0950   CHOLHDL 3.9 01/22/2019 0950   CHOLHDL 2.7 11/02/2014 1240   VLDL 10 11/02/2014 1240   LDLCALC 133 (H) 01/22/2019 0950   LDLDIRECT 52 11/05/2019 0951   LDLDIRECT 114 (H) 10/31/2011 0843      Wt Readings from Last 3 Encounters:  11/11/20 151 lb 9.6 oz (68.8 kg)  06/16/20 154 lb 3.2 oz (69.9 kg)  02/13/20 151 lb 6 oz (68.7 kg)      Other studies Reviewed: Additional studies/ records that were reviewed today include: Labs. Review of the above records demonstrates:  Please see elsewhere in the note.     ASSESSMENT AND PLAN:  SOB:    I will reassess this most likely with a 28-month follow-up and a treadmill test after we controlled his blood pressure and sugar.  I do not think this has progressed necessarily but it somewhat difficult to tell because of the language barrier.  HTN: His blood pressure is not controlled.  I am going to increase his lisinopril to 40 mg daily.  DM: We talked at length today.  He does  have diabetes.  He is likely going to need to be started on Metformin I have asked him to follow-up with his primary provider.  We talked at length about diet.  DYSLIPIDEMIA: I would like him to get a fasting lipid profile by his primary provider.  Goals of therapy will be based on this.  PALPITATIONS: These are rare and not particularly symptomatic.  No change in therapy.   Current medicines are reviewed at length with the patient today.  The patient does not have concerns regarding medicines.  The following changes have been made: As above  Labs/ tests ordered today include:   Orders Placed  This Encounter  Procedures  . EKG 12-Lead     Disposition:   FU with me in 6 months   Signed, Rollene Rotunda, MD  11/12/2020 7:30 AM    [facility]

## 2020-11-11 ENCOUNTER — Encounter: Payer: Self-pay | Admitting: Cardiology

## 2020-11-11 ENCOUNTER — Other Ambulatory Visit: Payer: Self-pay

## 2020-11-11 ENCOUNTER — Ambulatory Visit (INDEPENDENT_AMBULATORY_CARE_PROVIDER_SITE_OTHER): Payer: Self-pay | Admitting: Cardiology

## 2020-11-11 VITALS — BP 170/88 | HR 60 | Wt 151.6 lb

## 2020-11-11 DIAGNOSIS — I10 Essential (primary) hypertension: Secondary | ICD-10-CM

## 2020-11-11 DIAGNOSIS — R0602 Shortness of breath: Secondary | ICD-10-CM

## 2020-11-11 DIAGNOSIS — R072 Precordial pain: Secondary | ICD-10-CM

## 2020-11-11 DIAGNOSIS — E118 Type 2 diabetes mellitus with unspecified complications: Secondary | ICD-10-CM

## 2020-11-11 DIAGNOSIS — E785 Hyperlipidemia, unspecified: Secondary | ICD-10-CM

## 2020-11-11 IMAGING — CR DG CHEST 2V
2 series · 2 of 2 positions shown · non-contrast
Comparison: February 21, 2007

CLINICAL DATA: Chest pain.  Hypertension.

EXAM:
CHEST - 2 VIEW

[chest lat]
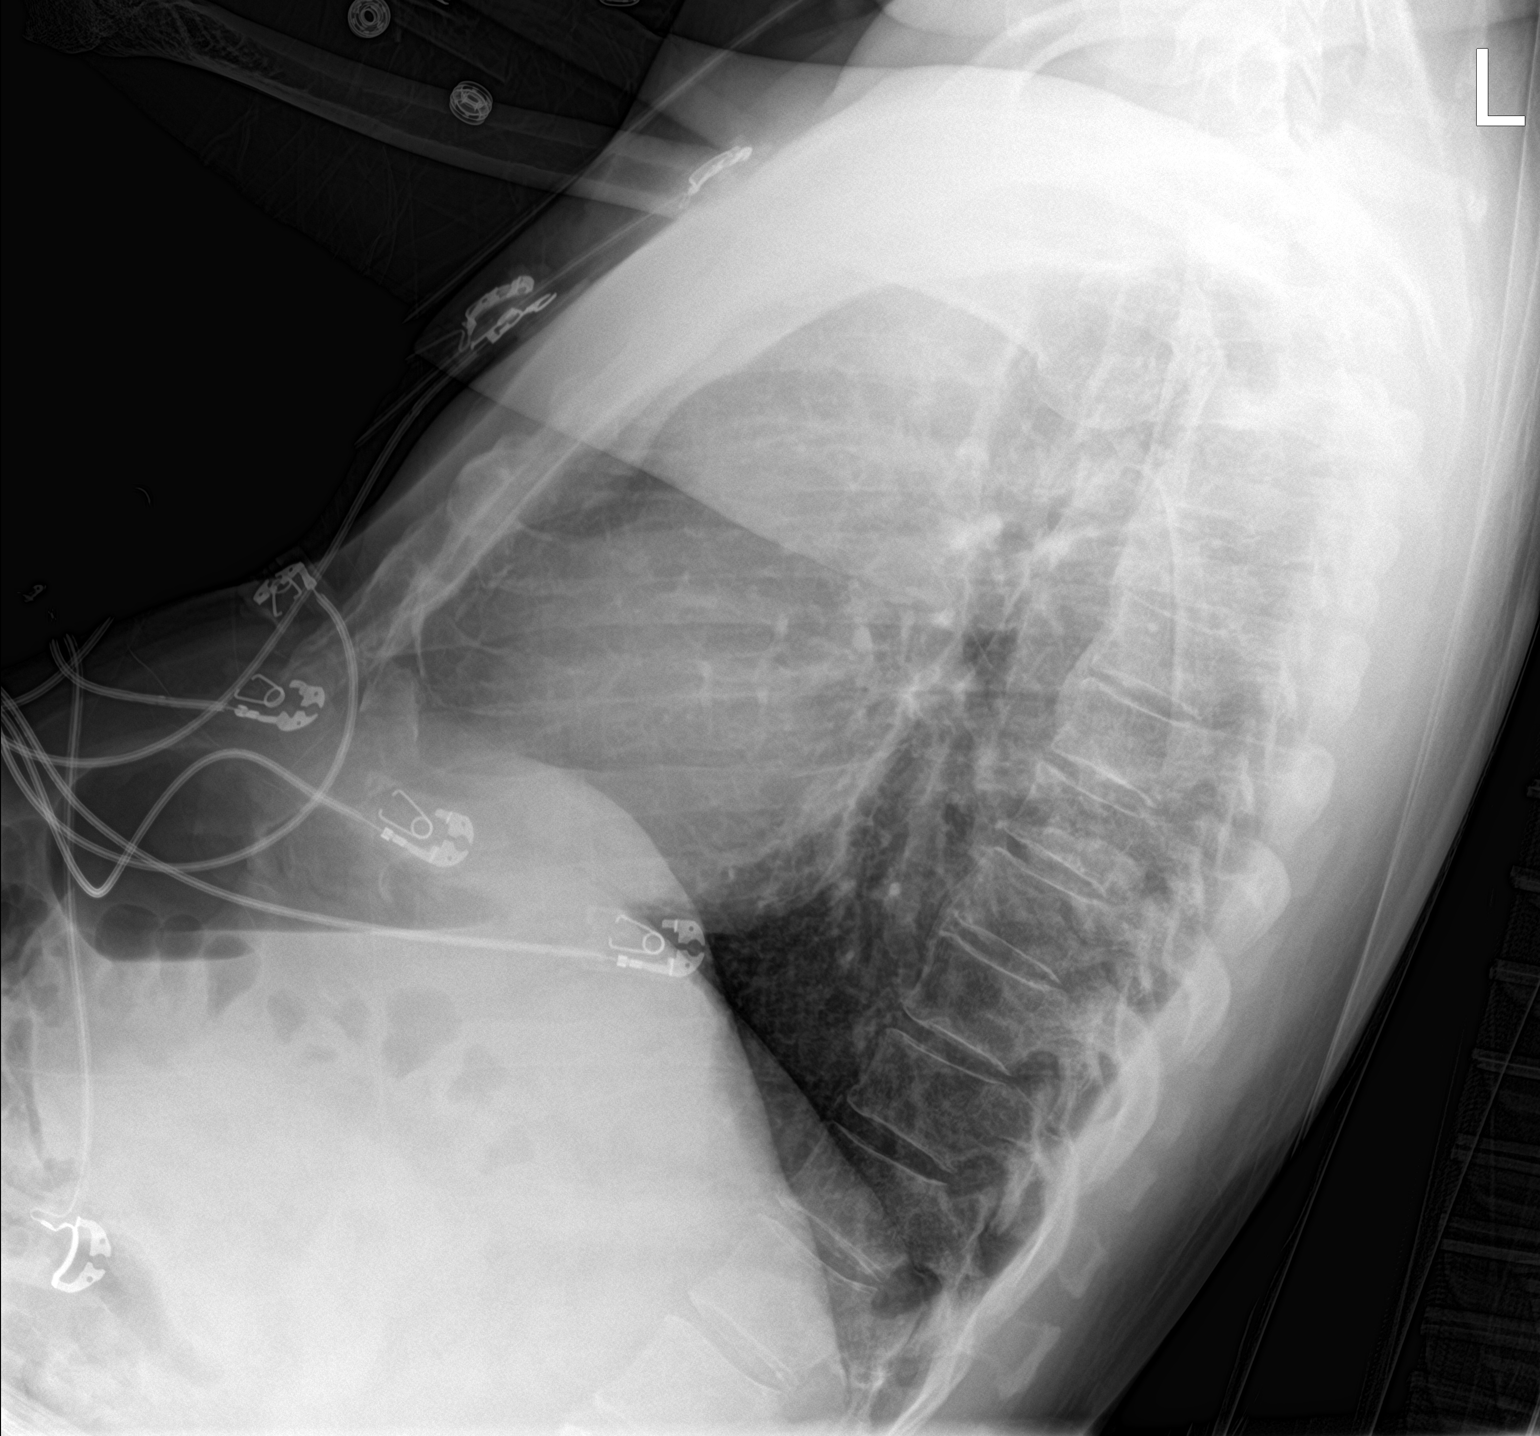

[chest ap]
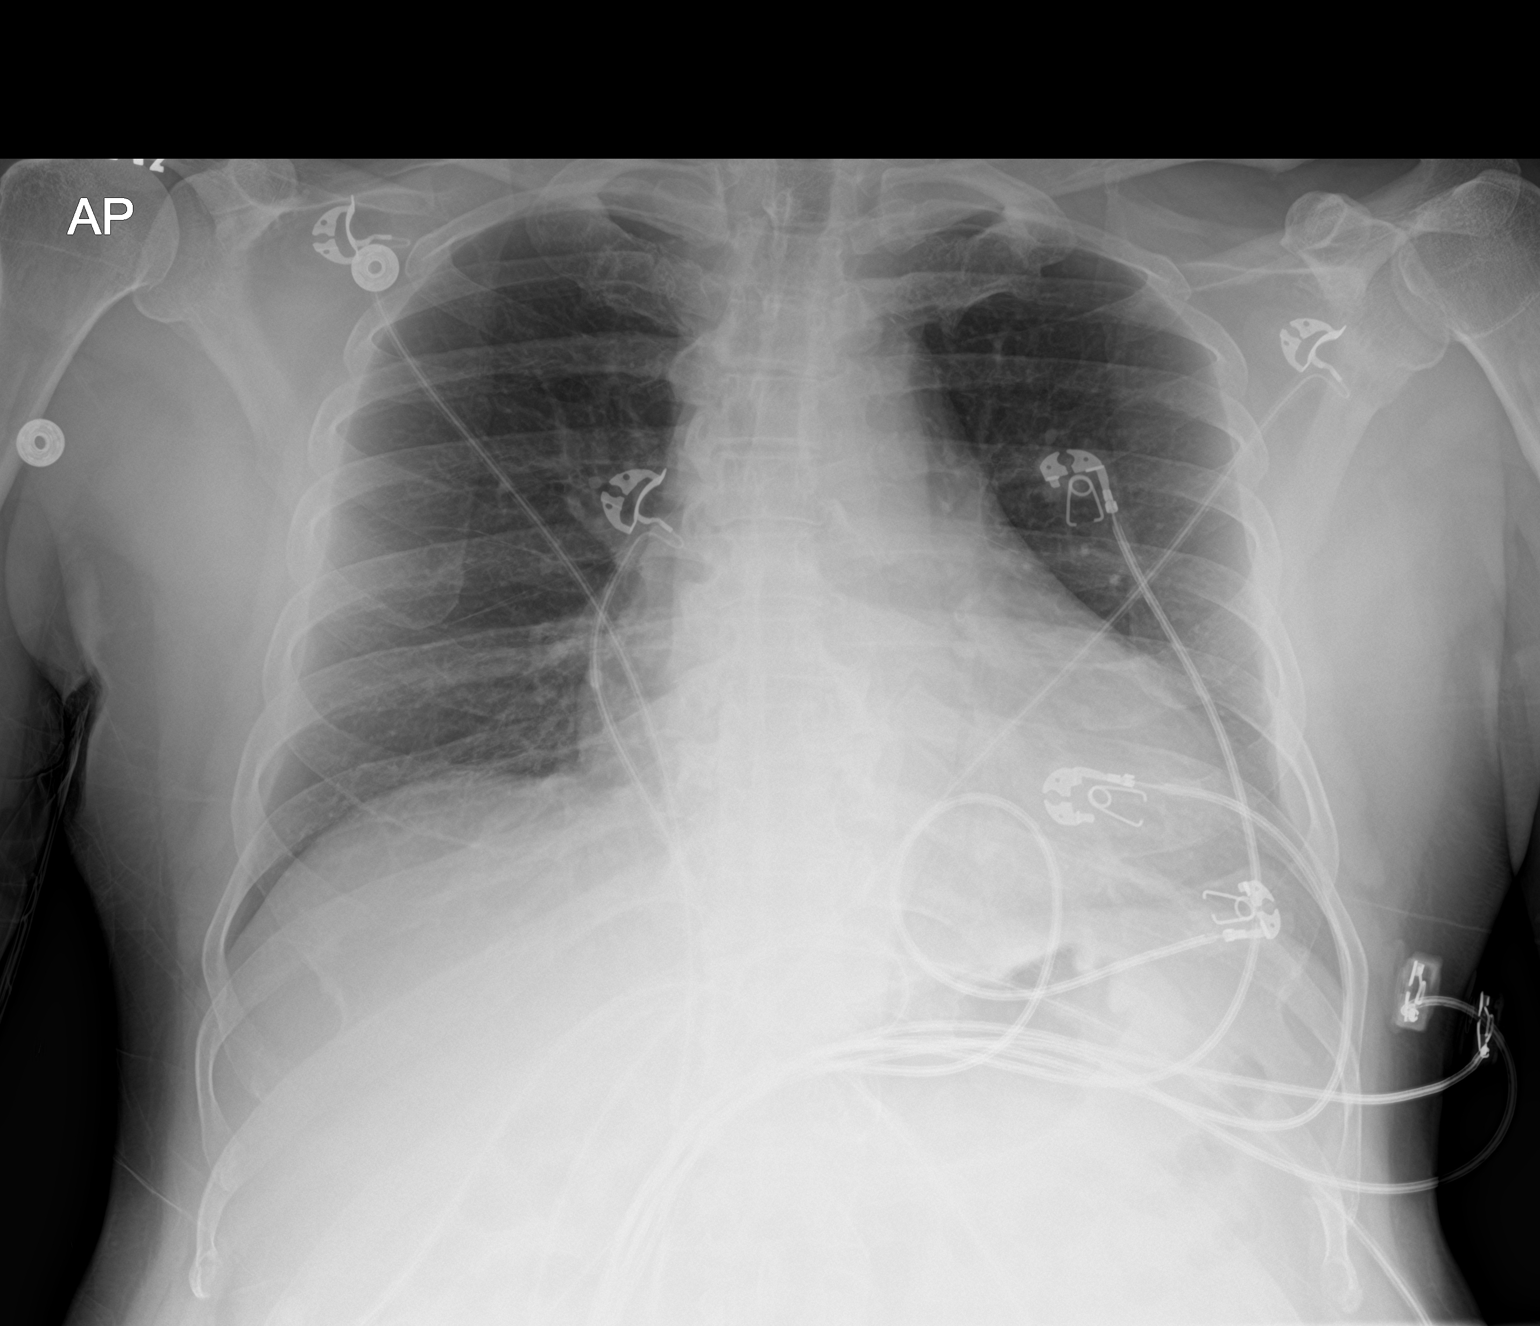

[2 of 2 positions shown; findings below may reference images not displayed]

FINDINGS: No edema or consolidation. Heart upper normal in size with pulmonary
vascularity normal. No adenopathy. There is degenerative change in
the thoracic spine.
IMPRESSION: No edema or consolidation.  Heart upper normal in size.

## 2020-11-11 MED ORDER — LISINOPRIL 40 MG PO TABS: 40.0000 mg | ORAL_TABLET | Freq: Every day | ORAL | 3 refills | Status: DC

## 2020-11-11 NOTE — Patient Instructions (Signed)
Medication Instructions:  INCREASE- Lisinopril 40 mg by mouth daily  *If you need a refill on your cardiac medications before your next appointment, please call your pharmacy*   Lab Work: None ordered   Testing/Procedures: None Ordered   Follow-Up: At [facility], you and your health needs are our priority.  As part of our continuing mission to provide you with exceptional heart care, we have created designated Provider Care Teams.  These Care Teams include your primary Cardiologist (physician) and Advanced Practice Providers (APPs -  Physician Assistants and Nurse Practitioners) who all work together to provide you with the care you need, when you need it.  We recommend signing up for the patient portal called "MyChart".  Sign up information is provided on this After Visit Summary.  MyChart is used to connect with patients for Virtual Visits (Telemedicine).  Patients are able to view lab/test results, encounter notes, upcoming appointments, etc.  Non-urgent messages can be sent to your provider as well.   To learn more about what you can do with MyChart, go to [email or web address].    Your next appointment:   6 month(s)  The format for your next appointment:   In Person  Provider:   You may see Rollene Rotunda, MD or one of the following Advanced Practice Providers on your designated Care Team:    Theodore Demark, PA-C  Joni Reining, DNP, ANP

## 2020-11-12 ENCOUNTER — Encounter: Payer: Self-pay | Admitting: Cardiology

## 2020-11-23 ENCOUNTER — Ambulatory Visit (INDEPENDENT_AMBULATORY_CARE_PROVIDER_SITE_OTHER): Payer: Self-pay | Admitting: Family Medicine

## 2020-11-23 ENCOUNTER — Encounter: Payer: Self-pay | Admitting: Family Medicine

## 2020-11-23 ENCOUNTER — Other Ambulatory Visit: Payer: Self-pay

## 2020-11-23 VITALS — BP 145/70 | HR 59 | Ht 61.93 in | Wt 152.8 lb

## 2020-11-23 DIAGNOSIS — I10 Essential (primary) hypertension: Secondary | ICD-10-CM

## 2020-11-23 DIAGNOSIS — E785 Hyperlipidemia, unspecified: Secondary | ICD-10-CM

## 2020-11-23 DIAGNOSIS — F1020 Alcohol dependence, uncomplicated: Secondary | ICD-10-CM

## 2020-11-23 DIAGNOSIS — E1169 Type 2 diabetes mellitus with other specified complication: Secondary | ICD-10-CM

## 2020-11-23 DIAGNOSIS — E118 Type 2 diabetes mellitus with unspecified complications: Secondary | ICD-10-CM

## 2020-11-23 LAB — POCT GLYCOSYLATED HEMOGLOBIN (HGB A1C): HbA1c, POC (controlled diabetic range): 6.9 % (ref 0.0–7.0)

## 2020-11-23 NOTE — Assessment & Plan Note (Signed)
Endorses abstinence

## 2020-11-23 NOTE — Assessment & Plan Note (Signed)
BP Readings from Last 3 Encounters:  11/23/20 (!) 145/70  11/11/20 (!) 170/88  06/16/20 (!) 158/86   Improved from last visit with cardiology and is taking increased dose of lisinopril.  Hopefully with diet change that will lead to weight loss his blood pressure will meet goal.  Asked to monitor at home and bring in cuff next visit.  Will check labs with lisinopril dose increase

## 2020-11-23 NOTE — Progress Notes (Signed)
    SUBJECTIVE:   CHIEF COMPLAINT / HPI:   HYPERTENSION Blood pressure range-not checking but has cuff Chest pain, palpitations- no       Medications Adherence- brings in his meds Lightheadedness,Syncope- no   Edema- no  DIABETES Blood Sugar ranges-not checking  Medication Adherence - wife has cut back on his cookies Hypoglycemic symptoms- no  HYPERLIPIDEMIA Medication Adherence has his crestor Right upper quadrant pain- no  Severe Muscle aches- no  ALCOHOL Not drinking at all.  His wife confirms   PERTINENT  PMH / PSH: history of elevated LFTs and low Na.  Has covid but recovered early in 2022  OBJECTIVE:   BP (!) 145/70   Pulse (!) 59   Ht 5' 1.93" (1.573 m)   Wt 152 lb 12.8 oz (69.3 kg)   SpO2 99%   BMI 28.01 kg/m   Psych:  Cognition and judgment appear intact. Alert, communicative  and cooperative with normal attention span and concentration. No apparent delusions, illusions, hallucinations Heart - Regular rate and rhythm.  No murmurs, gallops or rubs.    Lungs:  Normal respiratory effort, chest expands symmetrically. Lungs are clear to auscultation, no crackles or wheezes. Feet - normal exam and sensation.  No edema  ASSESSMENT/PLAN:   Hyperlipidemia associated with type 2 diabetes mellitus (HCC) Stable  Check labs today   Alcohol use disorder, moderate, dependence (HCC) Endorses abstinence  Primary hypertension BP Readings from Last 3 Encounters:  11/23/20 (!) 145/70  11/11/20 (!) 170/88  06/16/20 (!) 158/86   Improved from last visit with cardiology and is taking increased dose of lisinopril.  Hopefully with diet change that will lead to weight loss his blood pressure will meet goal.  Asked to monitor at home and bring in cuff next visit.  Will check labs with lisinopril dose increase  Diabetes mellitus type 2 with complications A1c at goal with improved diet.  Encourage him to continue this.   Since does not have established CAD less indication to start  metformin or SGLT2 but consider if control worsens      Carney Living, MD [facility]

## 2020-11-23 NOTE — Assessment & Plan Note (Addendum)
A1c at goal with improved diet.  Encourage him to continue this.   Since does not have established CAD less indication to start metformin or SGLT2 but consider if control worsens

## 2020-11-23 NOTE — Assessment & Plan Note (Signed)
Stable Check labs today 

## 2020-11-23 NOTE — Patient Instructions (Addendum)
Good to see you today - Thank you for coming in  Things we discussed today:  I will call you if your tests are not good.  Otherwise, I will send you a message on MyChart (if it is active) or a letter in the mail..  If you do not hear from me with in 2 weeks please call our office.     Your goal blood pressure is less than 135/80.  Check your blood pressure several times a week.  If regularly higher than this please let me know - either with MyChart or leaving a phone message. Next visit please bring in your blood pressure cuff.    You need an diabetes eye exam every year.  Please see your eye doctor.  Ask them to fax [place] a report of your exam  If the urinary urgency is bothering you enough then make an appointment to discuss   Please always bring your medication bottles  Come back to see me in 3 months  Me alegro de verte hoy. Gracias por venir.  Cosas que discutimos hoy:  Te llamar si tus pruebas no son buenas. De lo contrario, le enviar un mensaje en MyChart (si est [place]) o una carta por correo. Si no recibe noticias mas en 2 semanas, llame a nuestra oficina.  Su meta de presin arterial es menos de 135/80. Controle su presin arterial varias veces a la semana. Si regularmente es ms alto que esto, hgamelo saber, ya sea con MyChart o dejando un mensaje telefnico. Prxima visita por favor traiga su manguito de presin arterial.  Necesita un examen de la vista para la diabetes todos los aos. Consulte a su oftalmlogo. Pdales que nos enven por fax un informe de su examen.  Si la urgencia urinaria le est molestando lo suficiente, haga una cita para discutir  Por favor traiga siempre sus botellas de medicamentos  Vuelve a verme en 3 meses

## 2020-11-24 LAB — CMP14+EGFR
ALT: 27 IU/L (ref 0–44)
AST: 20 IU/L (ref 0–40)
Albumin/Globulin Ratio: 1.7 (ref 1.2–2.2)
Albumin: 4.8 g/dL (ref 3.8–4.9)
Alkaline Phosphatase: 102 IU/L (ref 44–121)
BUN/Creatinine Ratio: 14 (ref 9–20)
BUN: 11 mg/dL (ref 6–24)
Bilirubin Total: 0.5 mg/dL (ref 0.0–1.2)
CO2: 22 mmol/L (ref 20–29)
Calcium: 9.2 mg/dL (ref 8.7–10.2)
Chloride: 97 mmol/L (ref 96–106)
Creatinine, Ser: 0.78 mg/dL (ref 0.76–1.27)
Globulin, Total: 2.9 g/dL (ref 1.5–4.5)
Glucose: 114 mg/dL — ABNORMAL HIGH (ref 65–99)
Potassium: 4.1 mmol/L (ref 3.5–5.2)
Sodium: 134 mmol/L (ref 134–144)
Total Protein: 7.7 g/dL (ref 6.0–8.5)
eGFR: 103 mL/min/{1.73_m2} (ref >59)

## 2020-11-24 LAB — LIPID PANEL
Chol/HDL Ratio: 2.6 ratio (ref 0.0–5.0)
Cholesterol, Total: 124 mg/dL (ref 100–199)
HDL: 47 mg/dL (ref >39)
LDL Chol Calc (NIH): 61 mg/dL (ref 0–99)
Triglycerides: 78 mg/dL (ref 0–149)
VLDL Cholesterol Cal: 16 mg/dL (ref 5–40)

## 2020-12-02 ENCOUNTER — Other Ambulatory Visit: Payer: Self-pay | Admitting: Family Medicine

## 2020-12-02 DIAGNOSIS — E1169 Type 2 diabetes mellitus with other specified complication: Secondary | ICD-10-CM

## 2021-05-11 ENCOUNTER — Other Ambulatory Visit: Payer: Self-pay | Admitting: Family Medicine

## 2021-05-11 DIAGNOSIS — I10 Essential (primary) hypertension: Secondary | ICD-10-CM

## 2021-09-06 ENCOUNTER — Other Ambulatory Visit: Payer: Self-pay | Admitting: Family Medicine

## 2021-09-06 DIAGNOSIS — I10 Essential (primary) hypertension: Secondary | ICD-10-CM

## 2021-10-28 ENCOUNTER — Encounter: Payer: Self-pay | Admitting: Student

## 2021-10-28 ENCOUNTER — Ambulatory Visit (INDEPENDENT_AMBULATORY_CARE_PROVIDER_SITE_OTHER): Payer: Self-pay | Admitting: Student

## 2021-10-28 ENCOUNTER — Other Ambulatory Visit: Payer: Self-pay

## 2021-10-28 VITALS — BP 160/89 | HR 61 | Wt 151.6 lb

## 2021-10-28 DIAGNOSIS — E785 Hyperlipidemia, unspecified: Secondary | ICD-10-CM

## 2021-10-28 DIAGNOSIS — I209 Angina pectoris, unspecified: Secondary | ICD-10-CM

## 2021-10-28 DIAGNOSIS — E1169 Type 2 diabetes mellitus with other specified complication: Secondary | ICD-10-CM

## 2021-10-28 DIAGNOSIS — I10 Essential (primary) hypertension: Secondary | ICD-10-CM

## 2021-10-28 DIAGNOSIS — E118 Type 2 diabetes mellitus with unspecified complications: Secondary | ICD-10-CM

## 2021-10-28 LAB — POCT GLYCOSYLATED HEMOGLOBIN (HGB A1C): HbA1c, POC (controlled diabetic range): 7.3 % — AB (ref 0.0–7.0)

## 2021-10-28 MED ORDER — METFORMIN HCL ER 500 MG PO TB24: 500.0000 mg | ORAL_TABLET | Freq: Every day | ORAL | 0 refills | Status: DC

## 2021-10-28 MED ORDER — ISOSORBIDE MONONITRATE ER 30 MG PO TB24: 30.0000 mg | ORAL_TABLET | Freq: Every day | ORAL | 0 refills | Status: DC

## 2021-10-28 MED ORDER — NITROGLYCERIN 0.4 MG SL SUBL: 0.4000 mg | SUBLINGUAL_TABLET | SUBLINGUAL | 12 refills | Status: DC | PRN

## 2021-10-28 MED ORDER — METFORMIN HCL ER 500 MG PO TB24: 500.0000 mg | ORAL_TABLET | Freq: Every day | ORAL | 0 refills | Status: AC

## 2021-10-28 MED ORDER — AMLODIPINE BESYLATE 5 MG PO TABS: 5.0000 mg | ORAL_TABLET | Freq: Every day | ORAL | 11 refills | Status: DC

## 2021-10-28 NOTE — Assessment & Plan Note (Signed)
>>  ASSESSMENT AND PLAN FOR ANGINA PECTORIS (HCC) WRITTEN ON 10/28/2021 12:56 PM BY Bess Kinds, MD  Patient complaining of pressure/chest pain not associated with activity. Patient with history of coronary artery disease. EKG showed non-specific ST changes, no signs of ischemia. Patient having no compliants of chest pain today.  -Follow up with Cardiology/ referral placed -EKG as above -Nitroglycerin 0.4 mg prn, max 3 x in 24 hr -Imdur 30 mg daily

## 2021-10-28 NOTE — Progress Notes (Signed)
?SUBJECTIVE:  ? ?CHIEF COMPLAINT / HPI:  ? ?DM ?Lab Results  ?Component Value Date  ? HGBA1C 7.3 (A) 10/28/2021  ?Last A1c at goal ?Meds: none, had taken one of his metformin 500, because CBG @ home was 200 ?Symp: No symptoms of dehydration, or increased urination ?A1c today ? ?HTN: ?BP Readings from Last 3 Encounters:  ?10/28/21 (!) 160/89  ?11/23/20 (!) 145/70  ?11/11/20 (!) 170/88  ?CWC:BJSEG tartate 25 mg, Lisinopril 40 mg ?Compliance: taking ?Symp: No chest pain, SOB, vision changes, sometimes of a HA in the am, and sometimes SOB, but no chest pain, when he runs a lot or does strenuous activity.  ?Wife notes that he has pressure on his chest sometimes when doing nothing, or working. Happening 1 a week.  ?EKG ?BMP today ? ? ?HLD: ?Lipid panel today ?Lab Results  ?Component Value Date  ? CHOL 124 11/23/2020  ? HDL 47 11/23/2020  ? LDLCALC 61 11/23/2020  ? LDLDIRECT 52 11/05/2019  ? TRIG 78 11/23/2020  ? CHOLHDL 2.6 11/23/2020  ?Med: Atorvastatin 20 mg ?Compliance: taking meds ?Symp: ?Lipid panel today ? ? ?Right sided pain ?A small pain that comes and goes. Rated a 3/10. Happens out of no where. And sometimes when he eats. Feels like inflammation in the area, or like something is pressing in the area. Never has to take medicine for it. Stabbing pain. Lasting 20-30 min. Happening 10 min after eating. No nausea, vomiting, diarrhea. Sometimes feels like he may vomit. No trauma ? ? ? ?PERTINENT  PMH / PSH: HTN, DM, HLD ? ? ? ?OBJECTIVE:  ?BP (!) 160/89   Pulse 61   Wt 151 lb 9.6 oz (68.8 kg)   SpO2 100%   BMI 27.79 kg/m?  ? ?General: NAD, pleasant, able to participate in exam ?Cardiac: RRR, no murmurs auscultated. ?Respiratory: CTAB, normal effort, no wheezes, rales or rhonchi ?Abdomen: soft, non-tender, non-distended, normoactive bowel sounds ?Extremities: warm and well perfused, no edema or cyanosis. ?Skin: warm and dry, no rashes noted ?Neuro: alert, no obvious focal deficits, speech normal ?Psych: Normal  affect and mood ? ?ASSESSMENT/PLAN:  ?Angina pectoris (HCC) ?Patient complaining of pressure/chest pain not associated with activity. Patient with history of coronary artery disease. EKG showed non-specific ST changes, no signs of ischemia. Patient having no compliants of chest pain today.  ?-Follow up with Cardiology/ referral placed ?-EKG as above ?-Nitroglycerin 0.4 mg prn, max 3 x in 24 hr ?-Imdur 30 mg daily ? ?Primary hypertension ?Patient BP 160/89, despite good med compliance. Patient with history of electrolyte derangement following use of HCTZ/Lisionpril. Will start CCB today. Encouraged patient to follow up in 2 weeks. Patient with pulse around 60, thus will not adjust BB. ?-Amlodipine 5 mg ?-CMP ?-Continue metoprolol and lisinopril ? ?Diabetes mellitus type 2 with complications (HCC) ?Hemoglobin A1c 7.3 today. Will initiate metformin. Explained escalation dosing to patient.  ?Start Metfromin 500 mg in the morning ? Take 500 mg daily for a wk ? Take 1000 mg daily for a wk  ? Take 1500 mg daily for a wk (1000 mg in am, 500 mg in pm) ? Take 2000 mg daily for a wk (1000 mg BID) ?If you develop GI symptoms/having frequent bowel movements, go back down in dose. ? ? ?Hyperlipidemia associated with type 2 diabetes mellitus (HCC) ?Patient needing cholesterol checked today. Previously at goal, compliant with meds. ?-Lipid panel ?  ? ?Right sided abdominal pain ?Patient complaining of intermittent abdominal pain, rated 3/10. Encouraged pateint to follow  up at next visit.  ? ?Orders Placed This Encounter  ?Procedures  ? Lipid Panel  ? Comprehensive metabolic panel  ? Ambulatory referral to Cardiology  ?  Referral Priority:   Routine  ?  Referral Type:   Consultation  ?  Referral Reason:   Specialty Services Required  ?  Requested Specialty:   Cardiology  ?  Number of Visits Requested:   1  ? POCT glycosylated hemoglobin (Hb A1C)  ?  Associate with Z13.1  ? ?Meds ordered this encounter  ?Medications  ? nitroGLYCERIN  (NITROSTAT) 0.4 MG SL tablet  ?  Sig: Place 1 tablet (0.4 mg total) under the tongue every 5 (five) minutes as needed for chest pain.  ?  Dispense:  20 tablet  ?  Refill:  12  ? amLODipine (NORVASC) 5 MG tablet  ?  Sig: Take 1 tablet (5 mg total) by mouth daily.  ?  Dispense:  30 tablet  ?  Refill:  11  ? DISCONTD: metFORMIN (GLUCOPHAGE-XR) 500 MG 24 hr tablet  ?  Sig: Take 1 tablet (500 mg total) by mouth daily with breakfast. Toma 500 mg cada dia por una semana  (con desayuna) ?Proximo semana- Toma 1000 mg cada dia por una semanna (con desayuna) ?Proximo semana- Toma 1500 mg cada dia por una semanna (1000 mg por la ma?ana, 500 mg por la tarde) Proximo semana- Toma 2000 mg cada dia por una semanna (1000 mg por la ma?ana, 1000 mg por la tarde)  ?  Dispense:  70 tablet  ?  Refill:  0  ? isosorbide mononitrate (IMDUR) 30 MG 24 hr tablet  ?  Sig: Take 1 tablet (30 mg total) by mouth daily.  ?  Dispense:  30 tablet  ?  Refill:  0  ? DISCONTD: metFORMIN (GLUCOPHAGE-XR) 500 MG 24 hr tablet  ?  Sig: Take 1 tablet (500 mg total) by mouth daily with breakfast. Toma 500 mg cada dia por una semana  (con desayuna) ?Proximo semana- Toma 1000 mg cada dia por una semanna (con desayuna) ?Proximo semana- Toma 1500 mg cada dia por una semanna (1000 mg por la ma?ana, 500 mg por la tarde) Proximo semana- Toma 2000 mg cada dia por una semanna (1000 mg por la ma?ana, 1000 mg por la tarde)  ?  Dispense:  70 tablet  ?  Refill:  0  ? metFORMIN (GLUCOPHAGE-XR) 500 MG 24 hr tablet  ?  Sig: Take 1 tablet (500 mg total) by mouth daily with breakfast. Toma 500 mg cada dia por una semana  (con desayuna) ?Proximo semana- Toma 1000 mg cada dia por una semanna (con desayuna) ?Proximo semana- Toma 1500 mg cada dia por una semanna (1000 mg por la ma?ana, 500 mg por la tarde) Proximo semana- Toma 2000 mg cada dia por una semanna (1000 mg por la ma?ana, 1000 mg por la tarde)  ?  Dispense:  70 tablet  ?  Refill:  0  ? ?No follow-ups on  file. ?@SIGNNOTE @ ? ?

## 2021-10-28 NOTE — Assessment & Plan Note (Signed)
Patient needing cholesterol checked today. Previously at goal, compliant with meds. ?-Lipid panel ?

## 2021-10-28 NOTE — Assessment & Plan Note (Signed)
Hemoglobin A1c 7.3 today. Will initiate metformin. Explained escalation dosing to patient.  ?Start Metfromin 500 mg in the morning ? Take 500 mg daily for a wk ? Take 1000 mg daily for a wk  ? Take 1500 mg daily for a wk (1000 mg in am, 500 mg in pm) ? Take 2000 mg daily for a wk (1000 mg BID) ?? If you develop GI symptoms/having frequent bowel movements, go back down in dose. ? ?

## 2021-10-28 NOTE — Assessment & Plan Note (Addendum)
Patient BP 160/89, despite good med compliance. Patient with history of electrolyte derangement following use of HCTZ/Lisionpril. Will start CCB today. Encouraged patient to follow up in 2 weeks. Patient with pulse around 60, thus will not adjust BB. ?-Amlodipine 5 mg ?-CMP ?-Continue metoprolol and lisinopril ?

## 2021-10-28 NOTE — Assessment & Plan Note (Signed)
Patient complaining of pressure/chest pain not associated with activity. Patient with history of coronary artery disease. EKG showed non-specific ST changes, no signs of ischemia. Patient having no compliants of chest pain today.  ?-Follow up with Cardiology/ referral placed ?-EKG as above ?-Nitroglycerin 0.4 mg prn, max 3 x in 24 hr ?-Imdur 30 mg daily ?

## 2021-10-28 NOTE — Patient Instructions (Addendum)
It was great to see you! Thank you for allowing me to participate in your care! ? ?I recommend that you always bring your medications to each appointment as this makes it easy to ensure we are on the correct medications and helps [place] not miss when refills are needed. ? ?Our plans for today:  ?-Chest Pain ?Your chest pain is very seriuos! You could be having a heart attack! If you have chest pain, take nitroglycerin, every 5 minutes for pain. If pain does not go away after 3 pills, call 911! ? ? Start Imdur 30 mg daily ? Take nitroglycerin pill as needed for chest pain. Max 3 pills in a day ? Follow up with Cardiologist!! ?- High Blood Pressure ? Start Amlodipine 5 ? Check CMP today ?- Diabetes ? Hemoglobin A1c  was 7.3, this means we need to start a medicine. ? Start Metfromin 500 mg in the morning ? Toma 500 mg cada dia por una semana  (con desayuna) ?Proximo semana- Toma 1000 mg cada dia por una semanna (con desayuna) ?Proximo semana- Toma 1500 mg cada dia por una semanna (1000 mg por la ma?ana, 500 mg por la tarde) Proximo semana- Toma 2000 mg cada dia por una semanna (1000 mg por la ma?ana, 1000 mg por la tarde) ?If you develop GI symptoms/having frequent bowel movements, go back down in dose. ?- High cholesterol ? Checking Lipid panel ? ?-Follow up in 2 weeks ? ?We are checking some labs today, I will call you if they are abnormal will send you a MyChart message or a letter if they are normal.  If you do not hear about your labs in the next 2 weeks please let us know. ? ?Take care and seek immediate care sooner if you develop any concerns.  ? ?Dr. Holley Bouche, MD ?[place] ? ?In Spanish below! ? ??En espa?ol abajo! ? ? ? ??Estuvo muy bien verte! ?Gracias por permitirme participar en su cuidado! ? ?Le recomiendo que siempre traiga sus medicamentos a cada cita, ya que esto hace que sea m?s f?cil asegurarse de que estamos tomando los medicamentos correctos y nos ayuda a no perder cuando se Actor. ? ?Nuestros planes para hoy: ?-Dolor en el pecho ??Tu dolor de pecho es muy grave! ?Podr?as estar teniendo un ataque al coraz?n! Si tiene Tourist information centre manager, tome nitroglicerina cada 5 minutos para el dolor. Si el dolor no desaparece despu?s de 3 pastillas, ?llame al 911! ? ?Iniciar Imdur 30 mg diarios ?Tome una pastilla de nitroglicerina seg?n sea necesario para el dolor de pecho. M?ximo 3 pastillas en un d?a. ?Seguimiento con Cardi?logo!! ? ?- Hipertensi?n ?Iniciar amlodipino 5 ?Consulte CMP hoy ? ?- Diabetes ?La hemoglobina A1c fue de 7.3, esto significa que debemos comenzar con un medicamento. ?Comience Metfromin 500 mg por la ma?ana ?Toma 500 mg cada d?a por una semana (con desayunar) ?Proximo semana- Toma 1000 mg cada dia por una semana (con desayuna) ?Proximo semana- Toma 1500 mg cada dia por una semanna (1000 mg por la ma?ana, 500 mg por la tarde)  ?Proximo semana- Toma 2000 mg cada dia por una semanna (1000 mg por la ma?ana, 1000 mg por la tarde) ? Si desarrolla s?ntomas gastrointestinales/defeca con frecuencia, reduzca la dosis. ? ?- Colesterol alto ?Comprobaci?n del panel de l?pidos ? ?-Seguimiento en 2 semanas ? ?Estamos revisando algunos laboratorios hoy, lo llamar? si son anormales y Audiological scientist? un mensaje de MyChart o una carta si son normales. Si no se entera  de sus laboratorios en las pr?ximas 2 semanas, h?ganoslo saber. ? ?Cu?dese y busque atenci?n inmediata antes si tiene alguna inquietud. ? ?Dr. Holley Bouche, MD ?[facility] ? ? ? ? ?

## 2021-10-29 LAB — COMPREHENSIVE METABOLIC PANEL
ALT: 26 IU/L (ref 0–44)
AST: 21 IU/L (ref 0–40)
Albumin/Globulin Ratio: 1.2 (ref 1.2–2.2)
Albumin: 4.2 g/dL (ref 3.8–4.9)
Alkaline Phosphatase: 110 IU/L (ref 44–121)
BUN/Creatinine Ratio: 9 — ABNORMAL LOW (ref 10–24)
BUN: 8 mg/dL (ref 8–27)
Bilirubin Total: 0.4 mg/dL (ref 0.0–1.2)
CO2: 24 mmol/L (ref 20–29)
Calcium: 9.2 mg/dL (ref 8.6–10.2)
Chloride: 99 mmol/L (ref 96–106)
Creatinine, Ser: 0.92 mg/dL (ref 0.76–1.27)
Globulin, Total: 3.6 g/dL (ref 1.5–4.5)
Glucose: 128 mg/dL — ABNORMAL HIGH (ref 70–99)
Potassium: 4.4 mmol/L (ref 3.5–5.2)
Sodium: 137 mmol/L (ref 134–144)
Total Protein: 7.8 g/dL (ref 6.0–8.5)
eGFR: 95 mL/min/{1.73_m2} (ref >59)

## 2021-10-29 LAB — LIPID PANEL
Chol/HDL Ratio: 3.5 ratio (ref 0.0–5.0)
Cholesterol, Total: 163 mg/dL (ref 100–199)
HDL: 47 mg/dL (ref >39)
LDL Chol Calc (NIH): 99 mg/dL (ref 0–99)
Triglycerides: 90 mg/dL (ref 0–149)
VLDL Cholesterol Cal: 17 mg/dL (ref 5–40)

## 2021-11-01 MED ORDER — ATORVASTATIN CALCIUM 40 MG PO TABS: 40.0000 mg | ORAL_TABLET | Freq: Every day | ORAL | 1 refills | Status: DC

## 2021-11-01 NOTE — Addendum Note (Signed)
Addended by: Bess Kinds T on: 11/01/2021 09:51 AM ? ? Modules accepted: Orders ? ?

## 2021-11-18 ENCOUNTER — Other Ambulatory Visit: Payer: Self-pay | Admitting: Cardiology

## 2021-12-07 NOTE — Progress Notes (Signed)
? ?Cardiology Clinic Note  ? ?Patient Name: Jeffrey Gray ?Date of Encounter: 12/08/2021 ? ?Primary Care Provider:  Carney Living, MD ?Primary Cardiologist:  Rollene Rotunda, MD ? ?Patient Profile  ?  ?Jeffrey Gray 61-year-old male presents to the clinic today for follow-up evaluation of his hypertension and coronary artery disease. ? ?Past Medical History  ?  ?Past Medical History:  ?Diagnosis Date  ? Acid reflux 07/08/2013  ? Alcohol use disorder, moderate, dependence (HCC) 03/22/2009  ? Qualifier: Diagnosis of  By: Lafonda Mosses MD, Reche Dixon early Sept. 2012   ? 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 Anxiety 03/16/2012  ? Elevated ALT measurement 12/18/2016  ? Multiple labs 2017/2018 - suspect due to alcohol use/fatty liver  ? Elevated transaminase level 10/16/2011  ? Fatty infiltration of liver 11/06/2014  ? 11/06/14 - fatty liver and hepatocellular disease on [place] (no evidence of mass/malignancy per radiologist)   ? Ganglion cyst 08/02/2012  ? Dorsum left foot    ? Hyperlipidemia 04/05/2009  ? Hyperlipidemia associated with type 2 diabetes mellitus (HCC) 10/02/2018  ? Osgood-Schlatter's disease of right knee 09/05/2013  ? Primary hypertension 03/22/2009  ? Qualifier: Diagnosis of  By: Lafonda Mosses MD, Darl Pikes    ? Proteinuria   ? Type II or unspecified type diabetes mellitus without mention of complication, not stated as uncontrolled   ? Unspecified essential hypertension   ? ?Past Surgical History:  ?Procedure Laterality Date  ? abdominal cardiac cath  9/10  ? mild CAD, vasospasm  ? left leg fracture repair    ? ? ?Allergies ? ?No Known Allergies ? ?History of Present Illness  ?  ?Jeffrey Gray has a PMH of acid reflux, anxiety, alcohol use disorder, fatty liver, hyperlipidemia, type 2 diabetes, and mild coronary artery disease.  He underwent cardiac catheterization in 2010 which showed mild nonobstructive coronary disease.  It was felt that his symptoms were related to coronary  vasospasm.  He underwent stress testing, 2013, which showed no evidence of ischemia or infarct. ? ?He was seen in follow-up by Dr. Antoine Poche on 722.  During that time he continued to do well.  He continued to work in [employer].  He continued with chronic stable shortness of breath.  He did note occasional palpitations that were nonsustained.  He denied presyncope and syncope.  He denies shortness of breath orthopnea and PND at rest.  He denied chest pain.  He was noted to have an elevated A1c and he was instructed to touch base with his PCP about the increased value.  A Spanish interpreter was used throughout the visit. ? ?He presents to clinic today for follow-up evaluation states he feels well.  He continues to travel for work.  He reports that when he travels from work he has some diet and discrepancy.  At home when he prepares his meals he tries to eat low-sodium options.  He does not have a formal exercise regimen.  He reports that his blood pressure at home has been in the 130s systolic.  He presents with an interpreter today.  We reviewed his medications.  He is tolerating well and denies side effects.  I will give him the salty 6 diet sheet, have him increase his physical activity as tolerated, and plan follow-up in 12 months. ? ?Today he denies chest pain, increased shortness of breath, lower extremity edema, fatigue, increased palpitations, melena, hematuria, hemoptysis, diaphoresis, weakness, presyncope, syncope, orthopnea, and PND. ? ? ?Home Medications  ?  ?  Prior to Admission medications   ?Medication Sig Start Date End Date Taking? Authorizing Provider  ?amLODipine (NORVASC) 5 MG tablet Take 1 tablet (5 mg total) by mouth daily. 10/28/21 10/28/22  Bess KindsSowell, Brandon, MD  ?aspirin (ASPIRIN LOW DOSE) 81 MG EC tablet Take 1 tablet (81 mg total) by mouth daily. 11/28/16   Bonney AidHaney, Alyssa A, MD  ?atorvastatin (LIPITOR) 40 MG tablet Take 1 tablet (40 mg total) by mouth daily. 11/01/21   Bess KindsSowell, Brandon, MD   ?isosorbide mononitrate (IMDUR) 30 MG 24 hr tablet Take 1 tablet (30 mg total) by mouth daily. 10/28/21   Bess KindsSowell, Brandon, MD  ?lisinopril (ZESTRIL) 40 MG tablet Take 1 tablet (40 mg total) by mouth daily. Schedule an appointment for further refills, 1st attempt 11/18/21   Rollene RotundaHochrein, James, MD  ?metFORMIN (GLUCOPHAGE-XR) 500 MG 24 hr tablet Take 1 tablet (500 mg total) by mouth daily with breakfast. Toma 500 mg cada dia por una semana  (con desayuna) ?Proximo semana- Toma 1000 mg cada dia por una semanna (con desayuna) ?Proximo semana- Toma 1500 mg cada dia por una semanna (1000 mg por la ma?ana, 500 mg por la tarde) Proximo semana- Toma 2000 mg cada dia por una semanna (1000 mg por la ma?ana, 1000 mg por la tarde) 10/28/21   Bess KindsSowell, Brandon, MD  ?metoprolol tartrate (LOPRESSOR) 25 MG tablet Take 1 tablet by mouth twice daily 09/06/21   Moses MannersHensel, William A, MD  ?nitroGLYCERIN (NITROSTAT) 0.4 MG SL tablet Place 1 tablet (0.4 mg total) under the tongue every 5 (five) minutes as needed for chest pain. 10/28/21   Bess KindsSowell, Brandon, MD  ? ? ?Family History  ?  ?Family History  ?Problem Relation Age of Onset  ? Hypertension Mother   ? Liver disease Mother [id number]  ? Heart attack Father 282  ? Hypertension Father   ? Diabetes Father   ? Other Father   ?     colon issues  ? Heart attack Brother [id number]  ? Diabetes Brother   ? ?He indicated that his mother is alive. He indicated that his father is alive. He indicated that all of his six sisters are alive. He indicated that six of his seven brothers are alive. He indicated that both of his daughters are alive. He indicated that both of his sons are alive. ? ?Social History  ?  ?Social History  ? ?Socioeconomic History  ? Marital status: Married  ?  Spouse name: Not on file  ? Number of children: 4  ? Years of education: 10  ? Highest education level: Not on file  ?Occupational History  ? Occupation: Copyjanitor  ?  Comment: C  ?Tobacco Use  ? Smoking status: Never  ? Smokeless tobacco: Never   ?Substance and Sexual Activity  ? Alcohol use: Yes  ?  Alcohol/week: 6.0 standard drinks  ?  Types: 6 Cans of beer per week  ?  Comment: former heavy drinker, has cut back  ? Drug use: No  ? Sexual activity: Not on file  ?Other Topics Concern  ? Not on file  ?Social History Narrative  ? [place]  ? Lives with wife Shellia CleverlyGuillermina, together over 30 years, 4 children and 3 grandchildren  ? ?Social Determinants of Health  ? ?Financial Resource Strain: Not on file  ?Food Insecurity: Not on file  ?Transportation Needs: Not on file  ?Physical Activity: Not on file  ?Stress: Not on file  ?Social Connections: Not on file  ?Intimate Partner Violence: Not  on file  ?  ? ?Review of Systems  ?  ?General:  No chills, fever, night sweats or weight changes.  ?Cardiovascular:  No chest pain, dyspnea on exertion, edema, orthopnea, palpitations, paroxysmal nocturnal dyspnea. ?Dermatological: No rash, lesions/masses ?Respiratory: No cough, dyspnea ?Urologic: No hematuria, dysuria ?Abdominal:   No nausea, vomiting, diarrhea, bright red blood per rectum, melena, or hematemesis ?Neurologic:  No visual changes, wkns, changes in mental status. ?All other systems reviewed and are otherwise negative except as noted above. ? ?Physical Exam  ?  ?VS:  BP 136/70   Pulse 72   Resp 16   Ht 4\' 11"  (1.499 m)   Wt 150 lb 9.6 oz (68.3 kg)   SpO2 99%   BMI 30.42 kg/m?  , BMI Body mass index is 30.42 kg/m?. ?GEN: Well nourished, well developed, in no acute distress. ?HEENT: normal. ?Neck: Supple, no JVD, carotid bruits, or masses. ?Cardiac: RRR, no murmurs, rubs, or gallops. No clubbing, cyanosis, edema.  Radials/DP/PT 2+ and equal bilaterally.  ?Respiratory:  Respirations regular and unlabored, clear to auscultation bilaterally. ?GI: Soft, nontender, nondistended, BS + x 4. ?MS: no deformity or atrophy. ?Skin: warm and dry, no rash. ?Neuro:  Strength and sensation are intact. ?Psych: Normal affect. ? ?Accessory Clinical Findings  ?   ?Recent Labs: ?10/28/2021: ALT 26; BUN 8; Creatinine, Ser 0.92; Potassium 4.4; Sodium 137  ? ?Recent Lipid Panel ?   ?Component Value Date/Time  ? CHOL 163 10/28/2021 1022  ? TRIG 90 10/28/2021 1022  ? HDL 47 10/28/2021 1022  ?

## 2021-12-08 ENCOUNTER — Encounter: Payer: Self-pay | Admitting: General Practice

## 2021-12-08 ENCOUNTER — Ambulatory Visit (INDEPENDENT_AMBULATORY_CARE_PROVIDER_SITE_OTHER): Payer: Self-pay | Admitting: General Practice

## 2021-12-08 VITALS — BP 136/70 | HR 72 | Resp 16 | Ht 59.0 in | Wt 150.6 lb

## 2021-12-08 DIAGNOSIS — R002 Palpitations: Secondary | ICD-10-CM

## 2021-12-08 DIAGNOSIS — I10 Essential (primary) hypertension: Secondary | ICD-10-CM

## 2021-12-08 DIAGNOSIS — E118 Type 2 diabetes mellitus with unspecified complications: Secondary | ICD-10-CM

## 2021-12-08 DIAGNOSIS — I2510 Atherosclerotic heart disease of native coronary artery without angina pectoris: Secondary | ICD-10-CM

## 2021-12-08 DIAGNOSIS — R0602 Shortness of breath: Secondary | ICD-10-CM

## 2021-12-08 NOTE — Patient Instructions (Signed)
Medication Instructions:  ?Your physician recommends that you continue on your current medications as directed. Please refer to the Current Medication list given to you today. ? ?*If you need a refill on your cardiac medications before your next appointment, please call your pharmacy* ? ?Lab Work: ?NONE ordered at this time of appointment  ? ?If you have labs (blood work) drawn today and your tests are completely normal, you will receive your results only by: ?MyChart Message (if you have MyChart) OR ?A paper copy in the mail ?If you have any lab test that is abnormal or we need to change your treatment, we will call you to review the results. ? ?Testing/Procedures: ?NONE ordered at this time of appointment  ? ?Follow-Up: ?At [facility], you and your health needs are our priority.  As part of our continuing mission to provide you with exceptional heart care, we have created designated Provider Care Teams.  These Care Teams include your primary Cardiologist (physician) and Advanced Practice Providers (APPs -  Physician Assistants and Nurse Practitioners) who all work together to provide you with the care you need, when you need it. ? ?We recommend signing up for the patient portal called "MyChart".  Sign up information is provided on this After Visit Summary.  MyChart is used to connect with patients for Virtual Visits (Telemedicine).  Patients are able to view lab/test results, encounter notes, upcoming appointments, etc.  Non-urgent messages can be sent to your provider as well.   ?To learn more about what you can do with MyChart, go to [email or web address].   ? ?Your next appointment:   ?12 month(s) ? ?The format for your next appointment:   ?In Person ? ?Provider:   ?Rollene Rotunda, MD   ? ? ?Other Instructions ?INCREASE PHYSICAL ACTIVITY AS TOLERATED  ?MONITOR BLOOD PRESSURE AT HOME 1-2 TIMES PER WEEK  ? ? ?Important Information About Sugar ? ? ? ? ? ? ?

## 2021-12-20 ENCOUNTER — Encounter: Payer: Self-pay | Admitting: Student

## 2022-01-20 ENCOUNTER — Other Ambulatory Visit: Payer: Self-pay | Admitting: Cardiology

## 2022-01-20 ENCOUNTER — Other Ambulatory Visit: Payer: Self-pay | Admitting: Family Medicine

## 2022-01-20 DIAGNOSIS — I10 Essential (primary) hypertension: Secondary | ICD-10-CM

## 2022-02-28 ENCOUNTER — Other Ambulatory Visit: Payer: Self-pay | Admitting: Cardiology

## 2022-05-30 ENCOUNTER — Ambulatory Visit (INDEPENDENT_AMBULATORY_CARE_PROVIDER_SITE_OTHER): Payer: Self-pay | Admitting: Student

## 2022-05-30 VITALS — BP 130/64 | HR 62 | Ht 59.0 in | Wt 150.0 lb

## 2022-05-30 DIAGNOSIS — M79632 Pain in left forearm: Secondary | ICD-10-CM

## 2022-05-30 DIAGNOSIS — H9319 Tinnitus, unspecified ear: Secondary | ICD-10-CM

## 2022-05-30 MED ORDER — MELOXICAM 15 MG PO TABS: 15.0000 mg | ORAL_TABLET | Freq: Every day | ORAL | 0 refills | Status: AC

## 2022-05-30 NOTE — Patient Instructions (Addendum)
Ha sido maravilloso conocerte hoy. Gracias por permitirme ser parte de su cuidado. A continuacin se muestra un breve resumen de lo que discutimos en su visita de hoy:  Para el zumbido en su odo, he colocado una referencia para ver a Forensic psychologist en [employer]  Para su dolor en el antebrazo izquierdo, he recetado meloxicam que debe tomar una [place] [phone].  Debe tomar esto con las comidas.  Tambin puedes usar el gel Voltaren que puedes aplicar sobre el rea sensible.  Te animo a que utilices CHO-PAT ELBOW STRAP en la zona para evitar tracciones del antebrazo.  Puede encontrarlo en su farmacia.  Por favor, traiga todos sus medicamentos a cada cita!  Si tiene alguna pregunta o inquietud, no dude en comunicarse con nosotros por telfono o mensaje de MyChart.   Dr. Alvester Chou de [facility]

## 2022-05-30 NOTE — Progress Notes (Cosign Needed Addendum)
    SUBJECTIVE:   CHIEF COMPLAINT / HPI:   Patient is a 61 year old male presenting with concerns for ringing in the ear. Symptoms started about 5 days ago Denies ear pain, no fevers or chills. Denies any trauma to the ear or recent infection.  No recent change in medication. He endorses mild hearing loss and dizziness with positional changes.  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  He has normal strength and sensation. NVI Skin: Warm and dry  ASSESSMENT/PLAN:   Tinnitus Broad differentials for tinnitus which include presbycusis, Mnire disease, and medication induced. Patient does endorse mild hearing loss, vague vertigo and tinnitus which is concerning for Mnire although low probability given patient's age. Placed ambulatory referral for audiologist for patient to be seen by Tinnitus clinic with [facility].  Left forearm pain Patient's exam was reassuring. Clinical findings concerning for possible contusion of the forearm. Recommend application of Voltaren gel which you can apply over the tender area.  Encourage use of cold pack to elbow strap in the area to avoid tractions of the forearm.  Rx meloxicam 15 mg daily for 7 days.   Alen Bleacher, MD [place]

## 2022-07-02 ENCOUNTER — Other Ambulatory Visit: Payer: Self-pay | Admitting: Student

## 2022-07-02 ENCOUNTER — Other Ambulatory Visit: Payer: Self-pay | Admitting: Family Medicine

## 2022-07-02 DIAGNOSIS — I10 Essential (primary) hypertension: Secondary | ICD-10-CM

## 2022-08-15 ENCOUNTER — Other Ambulatory Visit: Payer: Self-pay | Admitting: Family Medicine

## 2022-08-15 DIAGNOSIS — I10 Essential (primary) hypertension: Secondary | ICD-10-CM

## 2022-09-16 ENCOUNTER — Other Ambulatory Visit: Payer: Self-pay | Admitting: Family Medicine

## 2022-09-16 DIAGNOSIS — I10 Essential (primary) hypertension: Secondary | ICD-10-CM

## 2022-10-09 ENCOUNTER — Other Ambulatory Visit: Payer: Self-pay | Admitting: Family Medicine

## 2022-10-09 DIAGNOSIS — I10 Essential (primary) hypertension: Secondary | ICD-10-CM

## 2022-10-25 ENCOUNTER — Other Ambulatory Visit: Payer: Self-pay | Admitting: Family Medicine

## 2022-11-14 ENCOUNTER — Other Ambulatory Visit: Payer: Self-pay | Admitting: Family Medicine

## 2022-11-14 DIAGNOSIS — I10 Essential (primary) hypertension: Secondary | ICD-10-CM

## 2022-12-02 ENCOUNTER — Other Ambulatory Visit: Payer: Self-pay | Admitting: Family Medicine

## 2023-01-05 ENCOUNTER — Other Ambulatory Visit: Payer: Self-pay | Admitting: Family Medicine

## 2023-01-05 ENCOUNTER — Other Ambulatory Visit: Payer: Self-pay | Admitting: Student

## 2023-01-05 ENCOUNTER — Other Ambulatory Visit: Payer: Self-pay | Admitting: Cardiology

## 2023-01-05 DIAGNOSIS — I10 Essential (primary) hypertension: Secondary | ICD-10-CM

## 2023-01-17 ENCOUNTER — Other Ambulatory Visit: Payer: Self-pay | Admitting: Cardiology

## 2023-02-19 ENCOUNTER — Other Ambulatory Visit: Payer: Self-pay | Admitting: Cardiology

## 2023-02-19 ENCOUNTER — Other Ambulatory Visit: Payer: Self-pay | Admitting: Family Medicine

## 2023-02-19 DIAGNOSIS — I10 Essential (primary) hypertension: Secondary | ICD-10-CM

## 2023-03-03 ENCOUNTER — Other Ambulatory Visit: Payer: Self-pay | Admitting: Family Medicine

## 2023-03-13 ENCOUNTER — Encounter: Payer: Self-pay | Admitting: Family Medicine

## 2023-03-13 ENCOUNTER — Ambulatory Visit (INDEPENDENT_AMBULATORY_CARE_PROVIDER_SITE_OTHER): Payer: Self-pay | Admitting: Family Medicine

## 2023-03-13 VITALS — BP 152/83 | HR 62 | Ht 59.0 in | Wt 149.4 lb

## 2023-03-13 DIAGNOSIS — E1169 Type 2 diabetes mellitus with other specified complication: Secondary | ICD-10-CM

## 2023-03-13 DIAGNOSIS — R1011 Right upper quadrant pain: Secondary | ICD-10-CM

## 2023-03-13 DIAGNOSIS — I10 Essential (primary) hypertension: Secondary | ICD-10-CM

## 2023-03-13 DIAGNOSIS — E118 Type 2 diabetes mellitus with unspecified complications: Secondary | ICD-10-CM

## 2023-03-13 DIAGNOSIS — E785 Hyperlipidemia, unspecified: Secondary | ICD-10-CM

## 2023-03-13 LAB — POCT GLYCOSYLATED HEMOGLOBIN (HGB A1C): HbA1c, POC (controlled diabetic range): 6.7 % (ref 0.0–7.0)

## 2023-03-13 MED ORDER — SIMETHICONE 40 MG/0.6ML PO SUSP
40.0000 mg | Freq: Four times a day (QID) | ORAL | Status: AC | PRN
Start: 2023-03-13 — End: ?

## 2023-03-13 MED ORDER — METOPROLOL TARTRATE 25 MG PO TABS
25.0000 mg | ORAL_TABLET | Freq: Two times a day (BID) | ORAL | 0 refills | Status: DC
Start: 2023-03-13 — End: 2023-06-16

## 2023-03-13 MED ORDER — LISINOPRIL 40 MG PO TABS
40.0000 mg | ORAL_TABLET | Freq: Every day | ORAL | 0 refills | Status: DC
Start: 2023-03-13 — End: 2023-06-16

## 2023-03-13 MED ORDER — ATORVASTATIN CALCIUM 40 MG PO TABS
40.0000 mg | ORAL_TABLET | Freq: Every day | ORAL | 1 refills | Status: DC
Start: 2023-03-13 — End: 2023-03-27

## 2023-03-13 MED ORDER — ATORVASTATIN CALCIUM 40 MG PO TABS
40.0000 mg | ORAL_TABLET | Freq: Every day | ORAL | 0 refills | Status: DC
Start: 2023-03-13 — End: 2023-03-13

## 2023-03-13 NOTE — Patient Instructions (Addendum)
It was great to see you! Thank you for allowing me to participate in your care!  Our plans for today:  - Please restart taking your Metformin. - I have refilled your BP medications.  - Please use GasX as needed for you flank pain.  - If the pain last more than 6hrs is worse than normal or you have fevers with it please go to the emergency room.   Please arrive 15 minutes PRIOR to your next scheduled appointment time! If you do not, this affects OTHER patients' care.  Take care and seek immediate care sooner if you develop any concerns.   Celine Mans, MD, PGY-2 [facility] 10:05 AM 03/13/2023  [facility]

## 2023-03-13 NOTE — Progress Notes (Signed)
    SUBJECTIVE:   CHIEF COMPLAINT / HPI: check-up  T2DM - Not taking Metformin. Stopped because "well controlled." Measures sugars at home. 130s normally, lowest 110.   BP - Amlodipine, Lisinopril. Not taking Lopressor or Imdur. Checking BP at home. 130 systolic yesterday. Has been out of lisinopril for 2 days.  Flank pain - right sided. Sometimes brought on by eating. No alleviating or aggravating factors. Intermittent. Feels like "gas". Going on for 2 months. Not taken any medications for it. No nausea or vomiting. No urinary symptoms. No numbness or tingling in saddle areas. One bowel movement daily.   PERTINENT  PMH / PSH: HTN, T2DM, HLD  OBJECTIVE:   BP (!) 152/83   Pulse 62   Ht 4\' 11"  (1.499 m)   Wt 149 lb 6.4 oz (67.8 kg)   SpO2 100%   BMI 30.18 kg/m   General: NAD  Neuro: A&O Cardiovascular: RRR, no murmurs, no peripheral edema Respiratory: normal WOB on RA, CTAB, no wheezes, ronchi or rales Abdomen: soft, NTTP, no rebound or guarding, no costovertebral angle tenderness Extremities: Moving all 4 extremities equally   ASSESSMENT/PLAN:   RUQ pain Assessment & Plan: Patient with history of alcohol use disorder and fatty liver on right upper quadrant ultrasound.  HPI most consistent with intermittent gas pain.  Will evaluate today with CMP.  Low suspicion for biliary cause at this time.  Reassuringly no jaundice or fevers.  No urinary symptoms suggest possible renal etiology.  If not improving with as needed Gas-X, will further evaluate with UA and likely right upper quadrant ultrasound at that time.  Biliary precautions given.  Orders: -     Simethicone  Diabetes mellitus type 2 with complications (HCC) Assessment & Plan: A1c controlled today at 6.7.  Assistant elevated recommend the patient continue to take metformin.  Will recheck in 3 months.  Orders: -     POCT glycosylated hemoglobin (Hb A1C) -     Lipid panel -     Comprehensive metabolic panel  Primary  hypertension Assessment & Plan: Patient's blood pressure is not controlled today. BP: (!) 152/83.  Patient is out of lisinopril for several days.  Goal of 140/90. Patient's medication regimen includes metoprolol, lisinopril, amlodipine. -Changes to current regimen include: Restart metoprolol, refill lisinopril, continue amlodipine -Labs: BMP -Follow up in 2 weeks   Orders: -     Lisinopril; Take 1 tablet (40 mg total) by mouth daily.  Dispense: 90 tablet; Refill: 0 -     Metoprolol Tartrate; Take 1 tablet (25 mg total) by mouth 2 (two) times daily.  Dispense: 90 tablet; Refill: 0  Hyperlipidemia associated with type 2 diabetes mellitus (HCC) Assessment & Plan: Goal LDL less than 70.  Will recheck lipid panel today.  Refilled atorvastatin.  Orders: -     Lipid panel -     Atorvastatin Calcium; Take 1 tablet (40 mg total) by mouth daily.  Dispense: 90 tablet; Refill: 1   Return in about 2 weeks (around 03/27/2023) for BP recheck.  Celine Mans, MD [facility]

## 2023-03-13 NOTE — Assessment & Plan Note (Signed)
Goal LDL less than 70.  Will recheck lipid panel today.  Refilled atorvastatin.

## 2023-03-13 NOTE — Assessment & Plan Note (Signed)
Patient with history of alcohol use disorder and fatty liver on right upper quadrant ultrasound.  HPI most consistent with intermittent gas pain.  Will evaluate today with CMP.  Low suspicion for biliary cause at this time.  Reassuringly no jaundice or fevers.  No urinary symptoms suggest possible renal etiology.  If not improving with as needed Gas-X, will further evaluate with UA and likely right upper quadrant ultrasound at that time.  Biliary precautions given.

## 2023-03-13 NOTE — Assessment & Plan Note (Signed)
A1c controlled today at 6.7.  Assistant elevated recommend the patient continue to take metformin.  Will recheck in 3 months.

## 2023-03-13 NOTE — Assessment & Plan Note (Signed)
Patient's blood pressure is not controlled today. BP: (!) 152/83.  Patient is out of lisinopril for several days.  Goal of 140/90. Patient's medication regimen includes metoprolol, lisinopril, amlodipine. -Changes to current regimen include: Restart metoprolol, refill lisinopril, continue amlodipine -Labs: BMP -Follow up in 2 weeks

## 2023-03-20 ENCOUNTER — Other Ambulatory Visit: Payer: Self-pay | Admitting: Family Medicine

## 2023-03-20 ENCOUNTER — Ambulatory Visit: Payer: Self-pay | Admitting: Family Medicine

## 2023-03-27 ENCOUNTER — Ambulatory Visit (INDEPENDENT_AMBULATORY_CARE_PROVIDER_SITE_OTHER): Payer: Self-pay | Admitting: Family Medicine

## 2023-03-27 ENCOUNTER — Encounter: Payer: Self-pay | Admitting: Family Medicine

## 2023-03-27 VITALS — BP 118/80 | HR 54 | Wt 146.0 lb

## 2023-03-27 DIAGNOSIS — E1169 Type 2 diabetes mellitus with other specified complication: Secondary | ICD-10-CM

## 2023-03-27 DIAGNOSIS — E785 Hyperlipidemia, unspecified: Secondary | ICD-10-CM

## 2023-03-27 DIAGNOSIS — K760 Fatty (change of) liver, not elsewhere classified: Secondary | ICD-10-CM

## 2023-03-27 DIAGNOSIS — I2510 Atherosclerotic heart disease of native coronary artery without angina pectoris: Secondary | ICD-10-CM

## 2023-03-27 DIAGNOSIS — R1011 Right upper quadrant pain: Secondary | ICD-10-CM

## 2023-03-27 DIAGNOSIS — I10 Essential (primary) hypertension: Secondary | ICD-10-CM

## 2023-03-27 DIAGNOSIS — F1020 Alcohol dependence, uncomplicated: Secondary | ICD-10-CM

## 2023-03-27 MED ORDER — ATORVASTATIN CALCIUM 80 MG PO TABS
80.0000 mg | ORAL_TABLET | Freq: Every day | ORAL | 2 refills | Status: DC
Start: 2023-03-27 — End: 2024-05-01

## 2023-03-27 NOTE — Patient Instructions (Signed)
Good to see you today - Thank you for coming in  Things we discussed today:  Cholesterol  Take 80 mg daily of Atorvastin every day   You need an diabetes eye exam every year.  Please see your eye doctor.  Ask them to fax [place] a report of your exam    Colon Cancer Screening  Call [phone] for FIT testing.   Please always bring your medication bottles  Come back to see me in 6 months

## 2023-03-27 NOTE — Assessment & Plan Note (Signed)
No angina.  Will increase atorvastatin to get to LDL goal < 100

## 2023-03-27 NOTE — Assessment & Plan Note (Signed)
Good control.  Continue currrent medications

## 2023-03-27 NOTE — Assessment & Plan Note (Signed)
Reports last alcohol almost 4 years ago.  Doing very well

## 2023-03-27 NOTE — Assessment & Plan Note (Signed)
Mild and not bothering him.  Recent lab work normal.  Will monitor

## 2023-03-27 NOTE — Assessment & Plan Note (Signed)
Has been alcohol free for almost 4 years and is controlling his weight.  Recent CMET normal LFTs.  Unsure if any further work up needed. Will increase lipitor to meet LDL goal.  Could consider future liver US although he does not have insurance.

## 2023-03-27 NOTE — Progress Notes (Signed)
    SUBJECTIVE:   CHIEF COMPLAINT / HPI:   Feels well.  Brings in all his medications.  Interpreter used  Hypertension No lightheadness or edema.  Cholesterol / CAD No chest pain with exertion or shortness of breath.  Last LDL 108. Not taking nitroglycerin or imdur  Alcohol Has not drank for almost 4 years  RUQ pain Mild intermittent.  Does not interfere with activities.  No nausea and vomiting or change in bowels.  Thinks might be indigestion     OBJECTIVE:   BP 118/80   Pulse (!) 54   Wt 146 lb (66.2 kg)   SpO2 98%   BMI 29.49 kg/m   Mobility:able to get up and down from exam table without assistance or distress Heart - Regular rate and rhythm.  No murmurs, gallops or rubs.    Lungs:  Normal respiratory effort, chest expands symmetrically. Lungs are clear to auscultation, no crackles or wheezes. Extremities:  No cyanosis, edema, or deformity noted with good range of motion of all major joints.     ASSESSMENT/PLAN:   Primary hypertension Assessment & Plan: Good control.  Continue currrent medications    Hyperlipidemia associated with type 2 diabetes mellitus (HCC) Assessment & Plan: Not at goal.  Increase lipitor to 80 mg daily   Orders: -     Atorvastatin Calcium; Take 1 tablet (80 mg total) by mouth daily.  Dispense: 90 tablet; Refill: 2  Atherosclerosis of native coronary artery without angina pectoris, unspecified whether native or transplanted heart Assessment & Plan: No angina.  Will increase atorvastatin to get to LDL goal < 100   Fatty infiltration of liver Assessment & Plan: Has been alcohol free for almost 4 years and is controlling his weight.  Recent CMET normal LFTs.  Unsure if any further work up needed. Will increase lipitor to meet LDL goal.  Could consider future liver US although he does not have insurance.    Alcohol use disorder, moderate, dependence (HCC) Assessment & Plan: Reports last alcohol almost 4 years ago.  Doing very  well   RUQ pain Assessment & Plan: Mild and not bothering him.  Recent lab work normal.  Will monitor    Gave him [facility] number for free FIT testing  Patient Instructions  Good to see you today - Thank you for coming in  Things we discussed today:  Cholesterol  Take 80 mg daily of Atorvastin every day   You need an diabetes eye exam every year.  Please see your eye doctor.  Ask them to fax [place] a report of your exam    Colon Cancer Screening  Call [phone] for FIT testing.   Please always bring your medication bottles  Come back to see me in 6 months   Carney Living, MD [facility]

## 2023-03-27 NOTE — Assessment & Plan Note (Signed)
Not at goal.  Increase lipitor to 80 mg daily

## 2023-05-11 ENCOUNTER — Other Ambulatory Visit: Payer: Self-pay | Admitting: Family Medicine

## 2023-05-11 DIAGNOSIS — Z1212 Encounter for screening for malignant neoplasm of rectum: Secondary | ICD-10-CM

## 2023-05-11 DIAGNOSIS — Z1211 Encounter for screening for malignant neoplasm of colon: Secondary | ICD-10-CM

## 2023-06-16 ENCOUNTER — Other Ambulatory Visit: Payer: Self-pay | Admitting: Family Medicine

## 2023-06-16 DIAGNOSIS — I10 Essential (primary) hypertension: Secondary | ICD-10-CM

## 2023-06-18 MED ORDER — LISINOPRIL 40 MG PO TABS
40.0000 mg | ORAL_TABLET | Freq: Every day | ORAL | 1 refills | Status: DC
Start: 2023-06-18 — End: 2023-10-18

## 2023-06-18 MED ORDER — METOPROLOL TARTRATE 25 MG PO TABS: 25.0000 mg | ORAL_TABLET | Freq: Two times a day (BID) | ORAL | 1 refills | Status: DC

## 2023-09-11 ENCOUNTER — Other Ambulatory Visit: Payer: Self-pay | Admitting: Family Medicine

## 2023-10-18 ENCOUNTER — Other Ambulatory Visit: Payer: Self-pay | Admitting: Cardiology

## 2023-10-18 DIAGNOSIS — I10 Essential (primary) hypertension: Secondary | ICD-10-CM

## 2023-10-23 ENCOUNTER — Ambulatory Visit (INDEPENDENT_AMBULATORY_CARE_PROVIDER_SITE_OTHER): Payer: Self-pay | Admitting: Student

## 2023-10-23 ENCOUNTER — Encounter: Payer: Self-pay | Admitting: Student

## 2023-10-23 VITALS — BP 164/83 | HR 54 | Ht 59.0 in | Wt 148.5 lb

## 2023-10-23 DIAGNOSIS — I10 Essential (primary) hypertension: Secondary | ICD-10-CM

## 2023-10-23 DIAGNOSIS — G44209 Tension-type headache, unspecified, not intractable: Secondary | ICD-10-CM

## 2023-10-23 DIAGNOSIS — H9312 Tinnitus, left ear: Secondary | ICD-10-CM

## 2023-10-23 DIAGNOSIS — M545 Low back pain, unspecified: Secondary | ICD-10-CM

## 2023-10-23 MED ORDER — AMLODIPINE BESYLATE 5 MG PO TABS
5.0000 mg | ORAL_TABLET | Freq: Every day | ORAL | 0 refills | Status: DC
Start: 2023-10-23 — End: 2024-02-15

## 2023-10-23 NOTE — Progress Notes (Deleted)
    SUBJECTIVE:   CHIEF COMPLAINT / HPI:     SUBJECTIVE:   CHIEF COMPLAINT / HPI:   BACK PAIN  Back pain began *** days ago. Pain is described as ***. Patient has tried ***. Pain radiates ***. History of trauma or injury: *** Patient believes might be causing their pain: ***  Prior history of similar pain: *** History of cancer: *** Weak immune system:  *** History of IV drug use: *** History of steroid use: ***  Symptoms Incontinence of bowel or bladder:  *** Numbness of leg: *** Fever: *** Rest or Night pain: *** Weight Loss:  **** Rash: ***   OBJECTIVE:   BP (!) 156/84   Pulse (!) 56   Ht 4\' 11"  (1.499 m)   Wt 148 lb 8 oz (67.4 kg)   SpO2 100%   BMI 29.99 kg/m   Appearance *** Back - Normal skin, Spine with normal alignment and no deformity.  *** tenderness to vertebral process palpation.  Paraspinous muscles are *** tender and without spasm.   *** Range of motion is full at neck and lumbar sacral regions Neuro Mobility ***  ASSESSMENT/PLAN:   There are no diagnoses linked to this encounter. Back Pain The most likely diagnosis is ***     I have considered and concluded the patient has a very low likelihood of having a Bone tumor; Fracture: Aortic aneurysm: Disk infection: or Pyelonephritis:  There are no Patient Instructions on file for this visit.   Tiffany Kocher, DO [facility]    PERTINENT  PMH / PSH: ***  OBJECTIVE:   BP (!) 156/84   Pulse (!) 56   Ht 4\' 11"  (1.499 m)   Wt 148 lb 8 oz (67.4 kg)   SpO2 100%   BMI 29.99 kg/m   ***  ASSESSMENT/PLAN:   No problem-specific Assessment & Plan notes found for this encounter.     Tiffany Kocher, DO [facility]

## 2023-10-23 NOTE — Progress Notes (Signed)
   SUBJECTIVE:   CHIEF COMPLAINT / HPI:   BACK PAIN + Headache + Left sided tinnitus -Tinnitus started 1 week ago, worse on left (however chart review shows this is chronic.  No hearing loss or dizziness.  -Headache daily for 48 hours, mild, reports normal sleep, bilateral across the front and down the neck, "tired sensation." Has happened previously, usually when not sleeping well- but this is more constant last 2 days. No illness symptoms.   Back pain began 4-5 days ago. Patient has tried nothing. Pain radiates: No. History of trauma or injury: No Patient believes might be causing their pain: Concerned it is his kidney's.  Prior history of similar pain: Yes History of cancer: No Weak immune system:  No History of IV drug use: No History of steroid use: No  Symptoms Incontinence of bowel or bladder:  None Numbness of leg: No Fever: No Rest or Night pain: No Weight Loss:  No Rash: No  OBJECTIVE:   BP (!) 164/83   Pulse (!) 54   Ht 4\' 11"  (1.499 m)   Wt 148 lb 8 oz (67.4 kg)   SpO2 100%   BMI 29.99 kg/m     Back - Normal skin, Spine with normal alignment and no deformity.  No tenderness to vertebral process palpation.  Paraspinous muscles are mildly tender and without spasm.   Normal Range of motion is full at neck and lumbar sacral regions Neuro: CN II: PERRL CN III, IV,VI: EOMI CV V: Normal sensation in V1, V2, V3 CVII: Symmetric smile and brow raise CN VIII: Normal hearing CN IX,X: Symmetric palate raise  CN XI: 5/5 shoulder shrug CN XII: Symmetric tongue protrusion  UE and LE strength 5/5 Normal sensation in UE and LE bilaterally  No ataxia with finger to nos   ASSESSMENT/PLAN:   Assessment & Plan Acute non intractable tension-type headache Prior episodes of headaches.  Despite age, does not have other risk factors for advanced imaging at this time.  Headache description consistent with tension type headache. - Conservative care including OTC therapies  discussed - Follow-up in 4 to 6 weeks and improving - Red flag symptoms and ED precautions discussed Acute bilateral low back pain without sciatica Acute low back pain likely 2/2 muscle strain.  No red flags.  Benign physical exam. 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  Denies reduced hearing or dizziness. - Follow-up with PCP at earliest convenience - Recommend hearing screen at that time Primary hypertension Elevated blood pressure today.  Patient added amlodipine.  Reports he is taking his metoprolol and lisinopril. - Refilled amlodipine today - Continue metoprolol and lisinopril - Follow-up in 2 weeks for blood pressure   Tiffany Kocher, DO [facility]

## 2023-10-23 NOTE — Assessment & Plan Note (Signed)
 Elevated blood pressure today.  Patient added amlodipine.  Reports he is taking his metoprolol and lisinopril. - Refilled amlodipine today - Continue metoprolol and lisinopril - Follow-up in 2 weeks for blood pressure

## 2023-10-23 NOTE — Patient Instructions (Addendum)
 It was great to see you! Thank you for allowing me to participate in your care!   I recommend that you always bring your medications to each appointment as this makes it easy to ensure we are on the correct medications and helps [place] not miss when refills are needed.  Our plans for today:  - Please take 1000 mg of Tylenol every 6 hours for pain - You can take 600 mg of Ibuprofen every 6 hours as needed for pain - You can also use Lidocaine patches on your back for pain - Please schedule a follow-up appointment in 2 weeks to discuss blood pressure and ringing in your ears  Take care and seek immediate care sooner if you develop any concerns. Please remember to show up 15 minutes before your scheduled appointment time!  Tiffany Kocher, DO [facility]

## 2023-11-12 ENCOUNTER — Ambulatory Visit: Payer: Self-pay | Admitting: Student

## 2024-02-15 ENCOUNTER — Other Ambulatory Visit: Payer: Self-pay | Admitting: Student

## 2024-02-15 DIAGNOSIS — I10 Essential (primary) hypertension: Secondary | ICD-10-CM

## 2024-02-26 ENCOUNTER — Other Ambulatory Visit: Payer: Self-pay | Admitting: Cardiology

## 2024-02-26 DIAGNOSIS — I10 Essential (primary) hypertension: Secondary | ICD-10-CM

## 2024-05-01 ENCOUNTER — Ambulatory Visit (INDEPENDENT_AMBULATORY_CARE_PROVIDER_SITE_OTHER): Payer: Self-pay | Admitting: Family Medicine

## 2024-05-01 VITALS — BP 123/80 | HR 59 | Ht 59.0 in | Wt 146.4 lb

## 2024-05-01 DIAGNOSIS — E118 Type 2 diabetes mellitus with unspecified complications: Secondary | ICD-10-CM

## 2024-05-01 DIAGNOSIS — E1169 Type 2 diabetes mellitus with other specified complication: Secondary | ICD-10-CM

## 2024-05-01 DIAGNOSIS — E785 Hyperlipidemia, unspecified: Secondary | ICD-10-CM

## 2024-05-01 DIAGNOSIS — M545 Low back pain, unspecified: Secondary | ICD-10-CM

## 2024-05-01 DIAGNOSIS — I10 Essential (primary) hypertension: Secondary | ICD-10-CM

## 2024-05-01 LAB — POCT GLYCOSYLATED HEMOGLOBIN (HGB A1C): HbA1c, POC (controlled diabetic range): 6.9 % (ref 0.0–7.0)

## 2024-05-01 MED ORDER — LISINOPRIL 40 MG PO TABS: 40.0000 mg | ORAL_TABLET | Freq: Every day | ORAL | 0 refills | Status: DC

## 2024-05-01 MED ORDER — ATORVASTATIN CALCIUM 80 MG PO TABS: 80.0000 mg | ORAL_TABLET | Freq: Every day | ORAL | 2 refills | Status: AC

## 2024-05-01 NOTE — Progress Notes (Signed)
    SUBJECTIVE:   CHIEF COMPLAINT / HPI: back pain  Back pain Right side lower back Intermittent last for around 2 hours usually Ongoing for 2 weeks No falls or injuries Gradually came on No numbness or tingling No radiation of pain Sometimes it occurs when he bends down No weakness Has not taken anything for pain yet Does take out the trash No overt manual labor Sometimes strains to urinate, and has dribbling of urine Never had kidney stone  PERTINENT  PMH / PSH: Hypertension, CAD, type 2 diabetes  OBJECTIVE:   BP 123/80   Pulse (!) 59   Ht 4' 11 (1.499 m)   Wt 146 lb 6 oz (66.4 kg)   SpO2 99%   BMI 29.56 kg/m   General: NAD, well appearing Neuro: A&O Respiratory: normal WOB on RA Extremities: Moving all 4 extremities equally  Back Exam Inspection: No obvious deformity, erythema, swelling, ecchymoses  Palpation: NTTP at bilateral lumbar musculature, spinous process ROM: Symmetric ROM of back in flexion, extension, lateral flexion and rotation Special Tests: Negative bilateral straight leg raise Strength: 5/5 hip flexion/extension, knee flexion extension, ankle plantar and dorsiflexion Neurovascular: Sensation intact Additional: No reproducible pain with FADIR and FABER of the right hip   ASSESSMENT/PLAN:   Assessment & Plan Acute right-sided low back pain without sciatica Suspect that this is intermittent paraspinal muscle spasm, no red flags to suggest acute spinal cord injury.  Could represent initial symptoms of degenerative disc disease.  Regardless, conservative treatment to begin with. - Naprosyn 500 mg twice daily for 1 week - Tylenol as needed - Lumbar back exercises provided - Follow-up 3 weeks - Consider lumbar x-rays if no improvement at that time Diabetes mellitus type 2 with complications (HCC) A1c today 6.9.  Further diabetes management at follow-up. Primary hypertension Refilled lisinopril  per patient request. Hyperlipidemia associated  with type 2 diabetes mellitus (HCC) Refilled atorvastatin  per patient request.  Return in about 3 weeks (around 05/22/2024) for HTN, T2DM.  Ozell Provencal, MD [facility]

## 2024-05-01 NOTE — Assessment & Plan Note (Signed)
 A1c today 6.9.  Further diabetes management at follow-up.

## 2024-05-01 NOTE — Assessment & Plan Note (Signed)
 Refilled atorvastatin  per patient request.

## 2024-05-01 NOTE — Assessment & Plan Note (Signed)
 Refilled lisinopril per patient request.

## 2024-05-01 NOTE — Patient Instructions (Addendum)
  Fue Psychiatrist verte! Gracias por permitirme participar en tu atencin!  Nuestros planes para hoy: - Tomar Naprosyn 500 mg dos veces al [employer]. Tomar esto durante una semana. - Por favor, programa una cita en las prximas 2 a 4 semanas con tu mdico de cabecera. - He renovado tu receta de atorvastatina y lisinopril .  Por favor, llega 15 minutos antes de tu prxima cita programada. Si no lo haces, esto afectar la atencin de otros pacientes.  Cudate y busca atencin inmediata antes si tienes alguna inquietud.  Ozell Provencal, MD, PGY-3 [facility] 15:57 25/04/2024 [facility]  It was great to see you! Thank you for allowing me to participate in your care!  Our plans for today:  - Take Naprosyn 500mg  twice a day. Take this for 1 week. - Please make an appointment in the next 2-4 weeks with your regular doctor. - I have refilled your Atorvastatin  and Lisinopril .   Please arrive 15 minutes PRIOR to your next scheduled appointment time! If you do not, this affects OTHER patients' care.  Take care and seek immediate care sooner if you develop any concerns.   Ozell Provencal, MD, PGY-3 [facility] 3:57 PM 05/01/2024  [facility]

## 2024-05-12 ENCOUNTER — Other Ambulatory Visit: Payer: Self-pay | Admitting: *Deleted

## 2024-05-12 DIAGNOSIS — I10 Essential (primary) hypertension: Secondary | ICD-10-CM

## 2024-05-12 MED ORDER — AMLODIPINE BESYLATE 5 MG PO TABS: 5.0000 mg | ORAL_TABLET | Freq: Every day | ORAL | 9 refills | Status: AC

## 2024-05-21 ENCOUNTER — Ambulatory Visit (INDEPENDENT_AMBULATORY_CARE_PROVIDER_SITE_OTHER): Payer: Self-pay

## 2024-05-21 VITALS — BP 142/80 | HR 57 | Ht 61.5 in | Wt 147.8 lb

## 2024-05-21 DIAGNOSIS — E785 Hyperlipidemia, unspecified: Secondary | ICD-10-CM

## 2024-05-21 DIAGNOSIS — E118 Type 2 diabetes mellitus with unspecified complications: Secondary | ICD-10-CM

## 2024-05-21 DIAGNOSIS — E1169 Type 2 diabetes mellitus with other specified complication: Secondary | ICD-10-CM

## 2024-05-21 DIAGNOSIS — I10 Essential (primary) hypertension: Secondary | ICD-10-CM

## 2024-05-21 DIAGNOSIS — Z5971 Insufficient health insurance coverage: Secondary | ICD-10-CM

## 2024-05-21 NOTE — Patient Instructions (Signed)
 It was good to see you today.   Please bring ALL of your medications with you to every visit.    Today we talked about: Diabetes and high blood pressure.     Thank you for choosing [facility]. Please refer to your mychart for specifics regarding today's visit or future appointments.

## 2024-05-21 NOTE — Assessment & Plan Note (Signed)
 A1C at goal. Last done in Aug, repeat due to Nov. FU 3 months for continued DM2 management. Get records of eye exam.

## 2024-05-21 NOTE — Assessment & Plan Note (Signed)
 Continue statin. Repeat lipid today

## 2024-05-21 NOTE — Progress Notes (Signed)
    SUBJECTIVE:   CHIEF COMPLAINT / HPI:   HTN BP Readings from Last 3 Encounters:  05/21/24 (!) 142/80  05/01/24 123/80  10/23/23 (!) 164/83  - amlodipine  5mg , lisinopril  40mg , lopressor  25mg  BID - Did not take BP meds today - Checks at home and [employer] usually systolic 135 - Reports some dizziness episodes but no major episodes. Once or twice a month. Maybe dehydrated on those days.   DM2 Lab Results  Component Value Date   HGBA1C 6.9 05/01/2024  - metformin  500mg  - Checks FSBS at home. Does not take if his sugars are less than 120. He states this happens several times per week.  - Reports he has had an eye exam this year.    PERTINENT  PMH / PSH: DM2, HTN, CAD, etoh use  OBJECTIVE:   BP (!) 142/80   Pulse (!) 57   Ht 5' 1.5 (1.562 m)   Wt 147 lb 12.8 oz (67 kg)   SpO2 100%   BMI 27.47 kg/m   Physical Exam General: Alert, conversant, cooperative. No acute distress.  HEENT: PERRL. EOMI. MMM.  Cardiovascular: RRR Respiratory: Lungs CTAB. Normal work of breathing. Abdomen: Non distended Extremities: No cyanosis. No edema Musculoskeletal: No gross deformities.  Skin: Warm. Dry. No rashes. No icterus.  Neurologic: No focal deficits. Moving all extremities. Psychiatric: Cooperative. Appropriate mood. Appropriate affect.   ASSESSMENT/PLAN:   Assessment & Plan Primary hypertension Elevated today but has not taken meds today. Home BP are at goal. Encouraged med adherence. Repeat yearly BMP today. Of note, unclear indication for metoprolol . No evidence of prior MI. May be able to DC medication after chart review.  Hyperlipidemia associated with type 2 diabetes mellitus (HCC) Continue statin. Repeat lipid today Diabetes mellitus type 2 with complications (HCC) A1C at goal. Last done in Aug, repeat due to Nov. FU 3 months for continued DM2 management. Get records of eye exam.  Uninsured Currently uninsured, making preventative care challenging to afford. Needs colon  cancer screening and vaccines. Declined today due to cost.      Milda LITTIE Deed, MD [facility]

## 2024-05-21 NOTE — Assessment & Plan Note (Signed)
 Elevated today but has not taken meds today. Home BP are at goal. Encouraged med adherence. Repeat yearly BMP today. Of note, unclear indication for metoprolol . No evidence of prior MI. May be able to DC medication after chart review.

## 2024-05-22 ENCOUNTER — Ambulatory Visit: Payer: Self-pay | Admitting: Family Medicine

## 2024-05-22 LAB — LIPID PANEL
Chol/HDL Ratio: 3.2 ratio (ref 0.0–5.0)
Cholesterol, Total: 139 mg/dL (ref 100–199)
HDL: 44 mg/dL (ref >39)
LDL Chol Calc (NIH): 73 mg/dL (ref 0–99)
Triglycerides: 123 mg/dL (ref 0–149)
VLDL Cholesterol Cal: 22 mg/dL (ref 5–40)

## 2024-05-22 LAB — BASIC METABOLIC PANEL WITH GFR
BUN/Creatinine Ratio: 13 (ref 10–24)
BUN: 11 mg/dL (ref 8–27)
CO2: 22 mmol/L (ref 20–29)
Calcium: 9.2 mg/dL (ref 8.6–10.2)
Chloride: 98 mmol/L (ref 96–106)
Creatinine, Ser: 0.84 mg/dL (ref 0.76–1.27)
Glucose: 124 mg/dL — ABNORMAL HIGH (ref 70–99)
Potassium: 4 mmol/L (ref 3.5–5.2)
Sodium: 135 mmol/L (ref 134–144)
eGFR: 99 mL/min/1.73 (ref >59)

## 2024-06-13 ENCOUNTER — Other Ambulatory Visit: Payer: Self-pay | Admitting: Family Medicine

## 2024-06-13 DIAGNOSIS — I10 Essential (primary) hypertension: Secondary | ICD-10-CM

## 2024-08-29 ENCOUNTER — Other Ambulatory Visit: Payer: Self-pay | Admitting: Family Medicine

## 2024-08-29 DIAGNOSIS — I10 Essential (primary) hypertension: Secondary | ICD-10-CM
